# Patient Record
Sex: Female | Born: 1990 | Race: White | Hispanic: No | Marital: Married | Smoking: Never smoker
Health system: Southern US, Community
[De-identification: ages and names within clinical notes are randomized; demographics above are authoritative.]

## PROBLEM LIST (undated history)

## (undated) DIAGNOSIS — N809 Endometriosis, unspecified: Secondary | ICD-10-CM

## (undated) DIAGNOSIS — D649 Anemia, unspecified: Secondary | ICD-10-CM

## (undated) DIAGNOSIS — N83209 Unspecified ovarian cyst, unspecified side: Secondary | ICD-10-CM

## (undated) HISTORY — DX: Endometriosis, unspecified: N80.9

## (undated) HISTORY — DX: Anemia, unspecified: D64.9

## (undated) HISTORY — DX: Unspecified ovarian cyst, unspecified side: N83.209

---

## 2012-12-29 ENCOUNTER — Ambulatory Visit: Payer: Self-pay | Admitting: Family Medicine

## 2014-09-24 IMAGING — US US PELV - US TRANSVAGINAL
2 series · 14 of 25 positions shown · non-contrast
Comparison: None

CLINICAL DATA: Nausea, vomiting, pelvic pain.

EXAM:
TRANSABDOMINAL AND TRANSVAGINAL ULTRASOUND OF PELVIS
TECHNIQUE: Both transabdominal and transvaginal ultrasound examinations of the
pelvis were performed. Transabdominal technique was performed for
global imaging of the pelvis including uterus, ovaries, adnexal
regions, and pelvic cul-de-sac. It was necessary to proceed with
endovaginal exam following the transabdominal exam to visualize the
uterus, endometrium, ovaries and adnexa .

[Series 1: us pelv - us transvaginal · 0.28mm/px · 1 of 1 slices shown (1 of 2)]
[im 1/1]
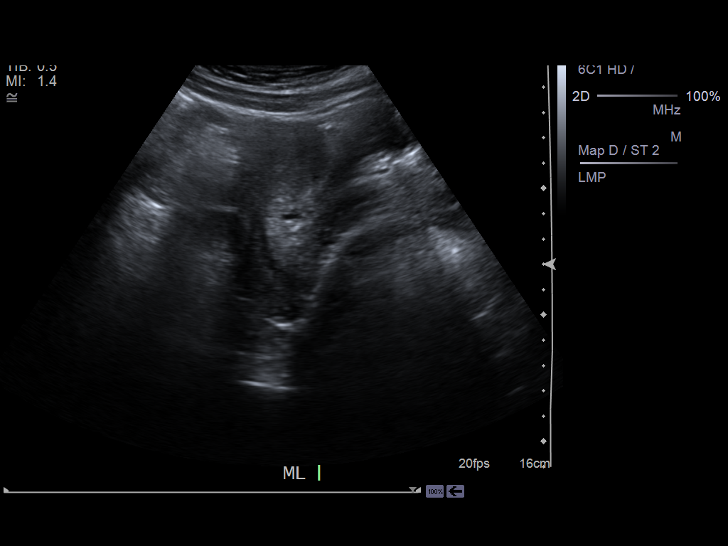

[Series 2: us pelv - us transvaginal · 0.18mm/px · 13 of 60 slices shown (2 of 2)]
[im 3/60]
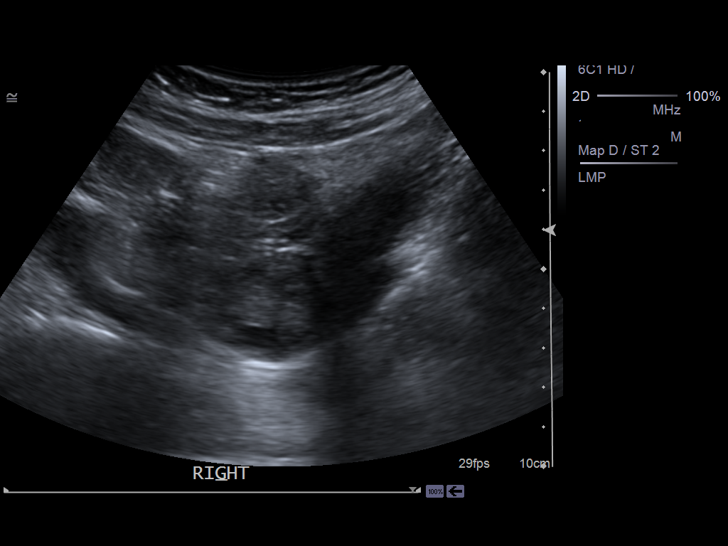
[im 8/60]
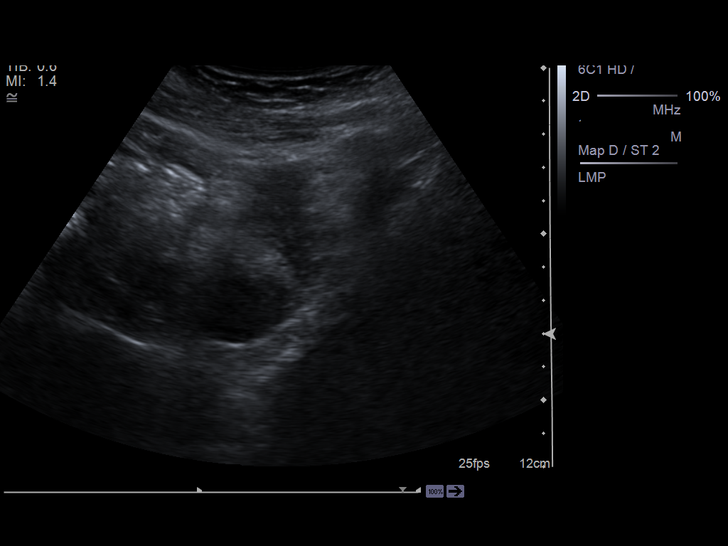
[im 13/60]
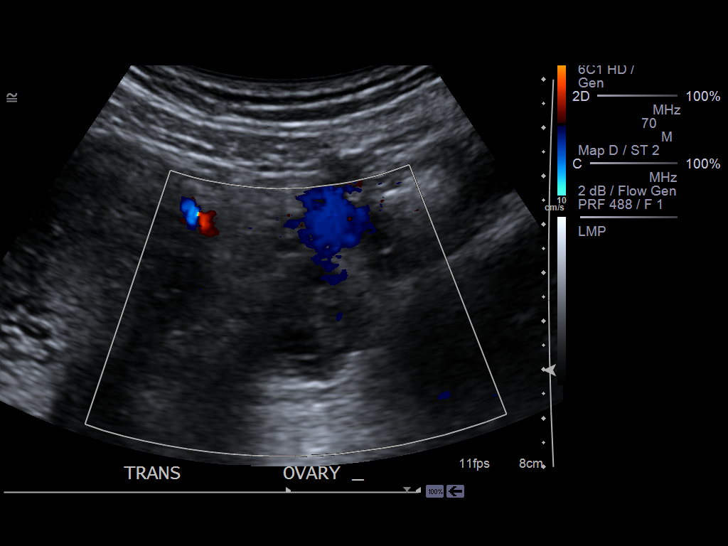
[im 18/60]
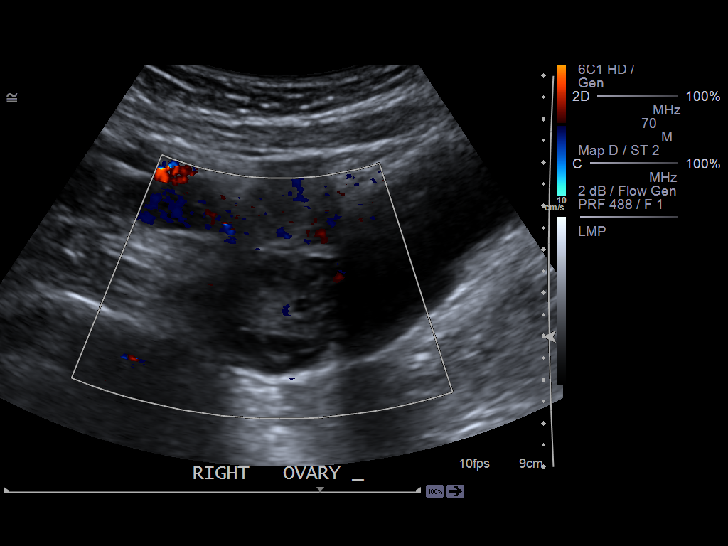
[im 21/60]
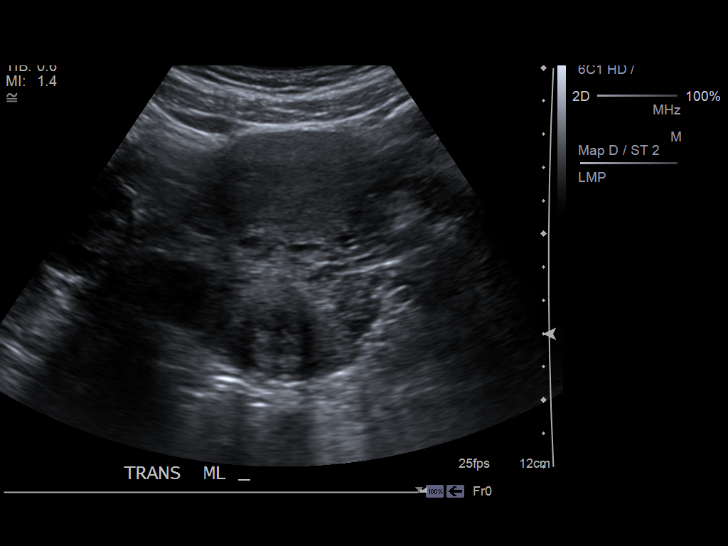
[im 26/60]
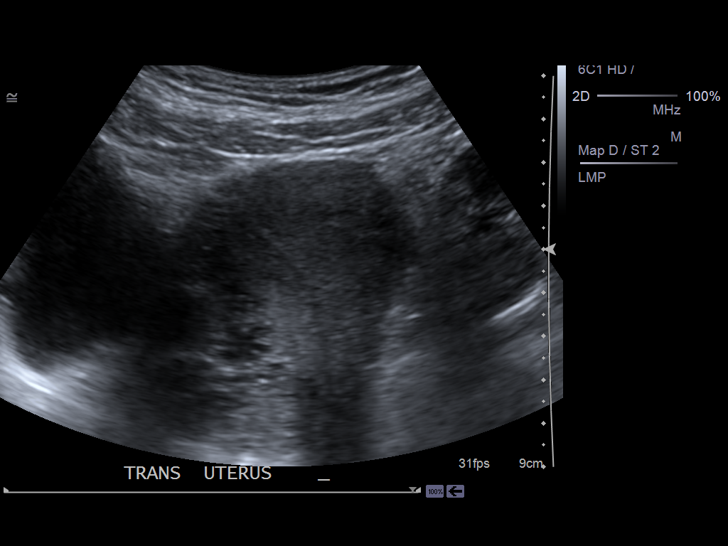
[im 31/60]
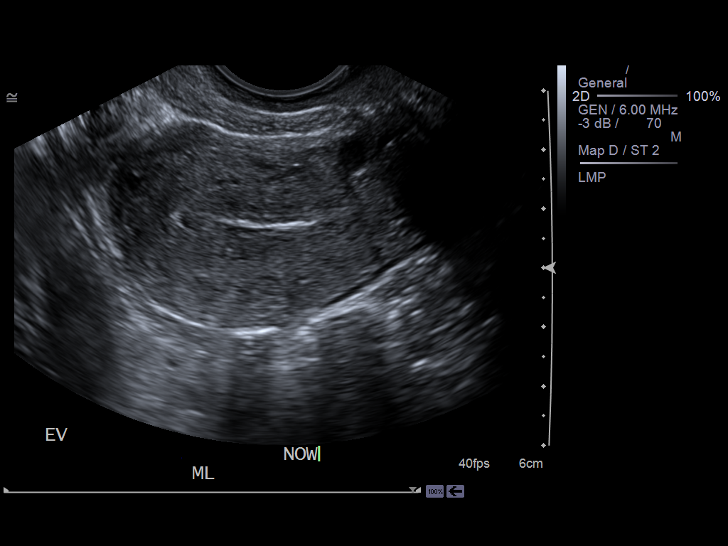
[im 36/60]
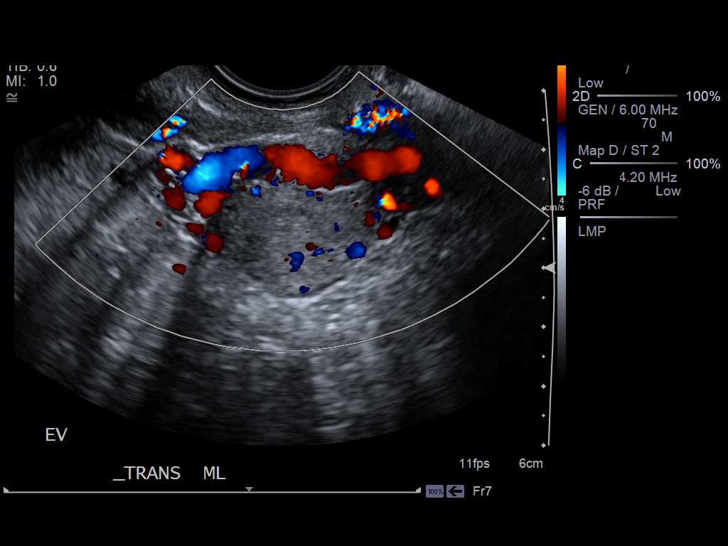
[im 39/60]
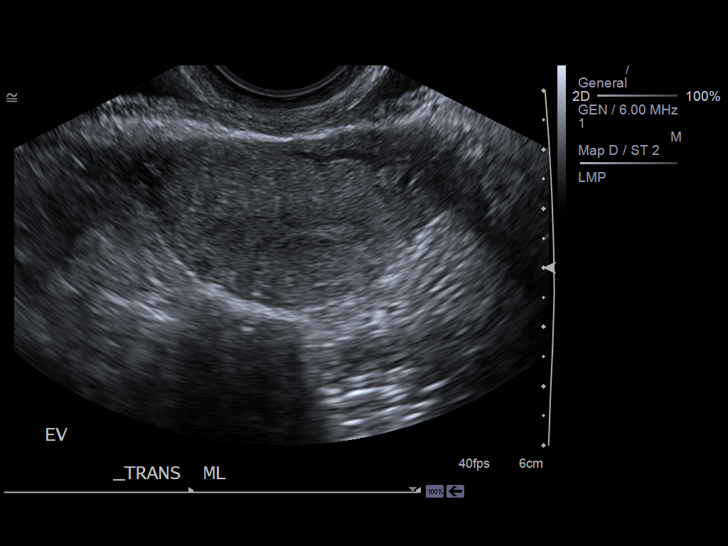
[im 44/60]
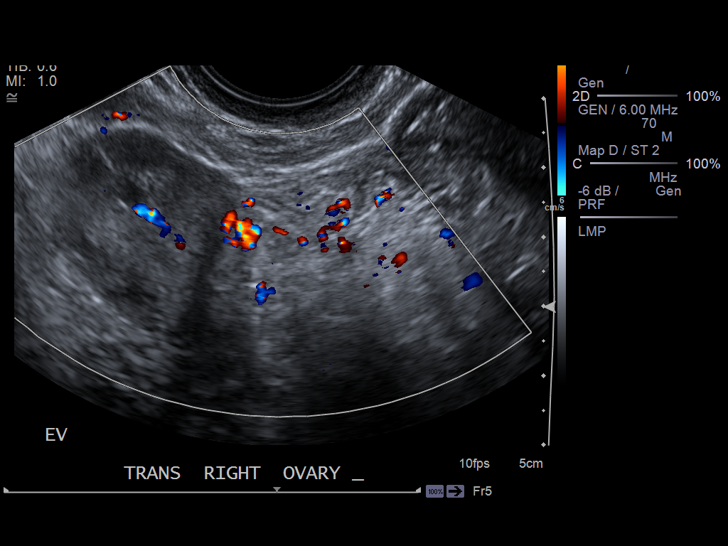
[im 49/60]
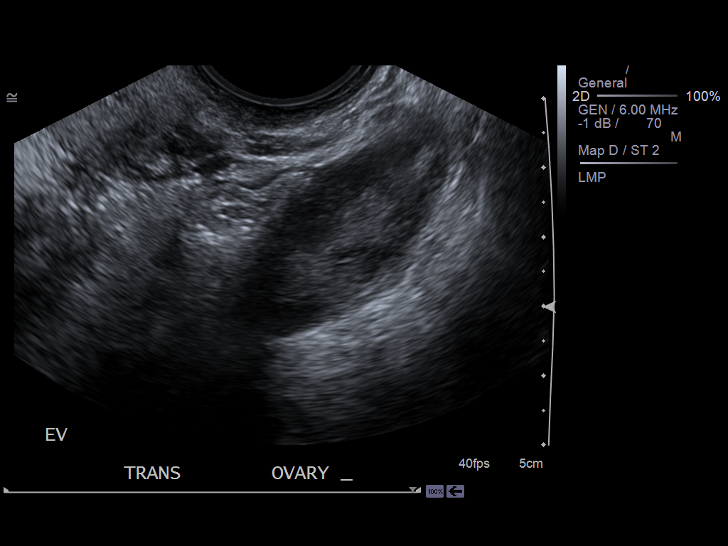
[im 54/60]
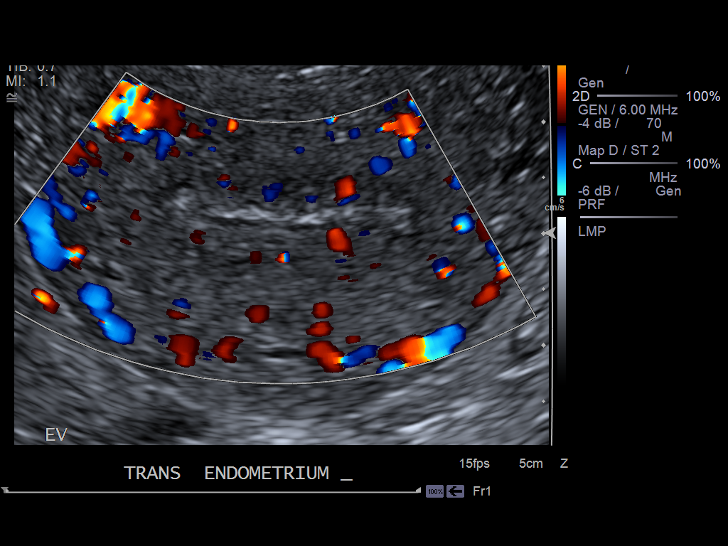
[im 60/60]
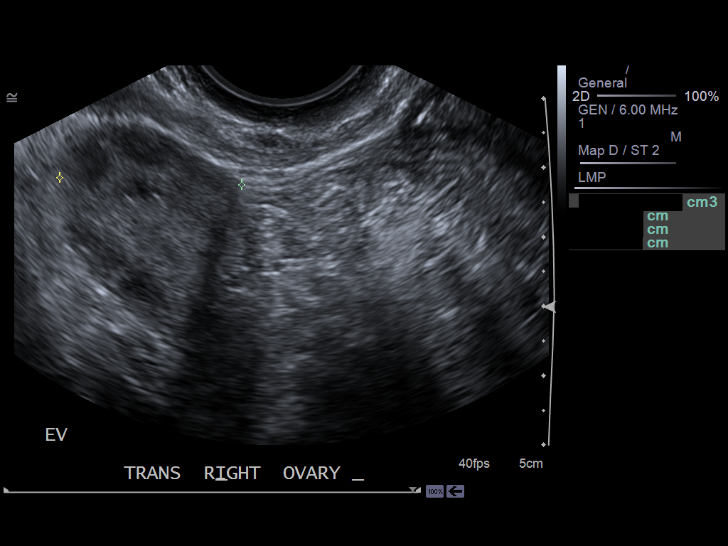

[14 of 25 positions shown; findings below may reference images not displayed]

FINDINGS: Uterus

Measurements: 7.9 x 4.0 x 4.6 cm. No fibroids or other mass
visualized.

Endometrium

Thickness: Normal thickness, 5 mm.. No focal abnormality visualized.

Right ovary

Measurements: 3.5 x 2.1 x 2.6 cm. Normal appearance/no adnexal mass.

Left ovary

Measurements: 3.2 x 2.1 x 3.9 cm. Normal appearance/no adnexal mass.

Other findings

No free fluid.
IMPRESSION: Unremarkable study.

## 2017-07-28 ENCOUNTER — Ambulatory Visit (INDEPENDENT_AMBULATORY_CARE_PROVIDER_SITE_OTHER): Payer: 59 | Admitting: Obstetrics & Gynecology

## 2017-07-28 ENCOUNTER — Other Ambulatory Visit (HOSPITAL_COMMUNITY)
Admission: RE | Admit: 2017-07-28 | Discharge: 2017-07-28 | Disposition: A | Discharge status: Home / Self Care | Payer: 59 | Source: Ambulatory Visit | Attending: Obstetrics & Gynecology | Admitting: Obstetrics & Gynecology

## 2017-07-28 ENCOUNTER — Encounter: Payer: Self-pay | Admitting: Obstetrics & Gynecology

## 2017-07-28 VITALS — BP 120/80 | Ht 64.0 in | Wt 184.0 lb

## 2017-07-28 DIAGNOSIS — Z124 Encounter for screening for malignant neoplasm of cervix: Secondary | ICD-10-CM | POA: Diagnosis not present

## 2017-07-28 DIAGNOSIS — N921 Excessive and frequent menstruation with irregular cycle: Secondary | ICD-10-CM | POA: Diagnosis not present

## 2017-07-28 DIAGNOSIS — D500 Iron deficiency anemia secondary to blood loss (chronic): Secondary | ICD-10-CM

## 2017-07-28 MED ORDER — MEDROXYPROGESTERONE ACETATE 10 MG PO TABS: ORAL_TABLET | ORAL | 0 refills | Status: DC

## 2017-07-28 NOTE — Patient Instructions (Signed)
Medroxyprogesterone tablets What is this medicine? MEDROXYPROGESTERONE (me DROX ee proe JES te rone) is a hormone in a class called progestins. It is commonly used to prevent the uterine lining from overgrowth in women taking an estrogen after menopause. It is also used to treat irregular menstrual bleeding or a lack of menstrual bleeding in women. This medicine may be used for other purposes; ask your health care provider or pharmacist if you have questions. COMMON BRAND NAME(S): Amen, Provera What should I tell my health care provider before I take this medicine? They need to know if you have any of these conditions: -blood vessel disease or a history of a blood clot in the lungs or legs -breast, cervical or vaginal cancer -heart disease -kidney disease -liver disease -migraine -recent miscarriage or abortion -mental depression -migraine -seizures (convulsions) -stroke -vaginal bleeding that has not been evaluated -an unusual or allergic reaction to medroxyprogesterone, other medicines, foods, dyes, or preservatives -pregnant or trying to get pregnant -breast-feeding How should I use this medicine? Take this medicine by mouth with a glass of water. Follow the directions on the prescription label. Take your doses at regular intervals. Do not take your medicine more often than directed. Talk to your pediatrician regarding the use of this medicine in children. Special care may be needed. While this drug may be prescribed for children as young as 13 years for selected conditions, precautions do apply. Overdosage: If you think you have taken too much of this medicine contact a poison control center or emergency room at once. NOTE: This medicine is only for you. Do not share this medicine with others. What if I miss a dose? If you miss a dose, take it as soon as you can. If it is almost time for your next dose, take only that dose. Do not take double or extra doses. What may interact with  this medicine? -barbiturate medicines for inducing sleep or treating seizures (convulsions) -bosentan -carbamazepine -phenytoin -rifampin -St. John's Wort This list may not describe all possible interactions. Give your health care provider a list of all the medicines, herbs, non-prescription drugs, or dietary supplements you use. Also tell them if you smoke, drink alcohol, or use illegal drugs. Some items may interact with your medicine. What should I watch for while using this medicine? Visit your health care professional for regular checks on your progress. You will need a regular breast and pelvic exam. If you have any reason to think you are pregnant, stop taking this medicine at once and contact your doctor or health care professional. What side effects may I notice from receiving this medicine? Side effects that you should report to your doctor or health care professional as soon as possible: -breast tenderness or discharge -changes in mood or emotions, such as depression -changes in vision or speech -pain in the abdomen, chest, groin, or leg -severe headache -skin rash, itching, or hives -sudden shortness of breath -unusually weak or tired -yellowing of skin or eyes Side effects that usually do not require medical attention (report to your doctor or health care professional if they continue or are bothersome): -acne -change in menstrual bleeding pattern or flow -changes in sexual desire -facial hair growth -fluid retention and swelling -headache -upset stomach -weight gain or loss This list may not describe all possible side effects. Call your doctor for medical advice about side effects. You may report side effects to FDA at 1-800-FDA-1088. Where should I keep my medicine? Keep out of the reach of children.   Store at room temperature between 20 and 25 degrees C (68 and 77 degrees F). Throw away any unused medicine after the expiration date. NOTE: This sheet is a summary. It  may not cover all possible information. If you have questions about this medicine, talk to your doctor, pharmacist, or health care provider.  2018 Elsevier/Gold Standard (2007-12-21 11:26:12)  

## 2017-07-28 NOTE — Progress Notes (Signed)
HPI:      [name] is a 27 y.o. G1P0010 who LMP was Patient's last menstrual period was 07/04/2017., presents today for a problem visit.  She complains of metrorrhagia (no pattern) that  began several weeks ago and its severity is described as moderate. She had premenstrual spotting for one week then period for 8 days (usu 5) then postmenstrual spotting and at times heavier bleeding for the last 3 weeks. She usually has regular periods every 28 days and they are associated with mild menstrual cramping.  Assoc w dyspareunia.  She has used the following for attempts at control: maxi pad.  Previous evaluation: none. Prior Diagnosis: anemia due to periods, no other dx. Previous Treatment: tried OCP as 27 yo and did not tolerate then.  She is single partner, contraception - none.  Contraception: none. Hx of STDs: none. She is premenopausal.  PMHx: She  has a past medical history of Anemia. Also,  has no past surgical history on file., family history is not on file. She was adopted.,  reports that she has never smoked. She has never used smokeless tobacco. She reports that she drank alcohol. She reports that she does not use drugs.  She takes no meds now. Also, has No Known Allergies.  Review of Systems  Constitutional: Negative for chills, fever and malaise/fatigue.  HENT: Negative for congestion, sinus pain and sore throat.   Eyes: Negative for blurred vision and pain.  Respiratory: Negative for cough and wheezing.   Cardiovascular: Negative for chest pain and leg swelling.  Gastrointestinal: Negative for abdominal pain, constipation, diarrhea, heartburn, nausea and vomiting.  Genitourinary: Negative for dysuria, frequency, hematuria and urgency.  Musculoskeletal: Negative for back pain, joint pain, myalgias and neck pain.  Skin: Negative for itching and rash.  Neurological: Negative for dizziness, tremors and weakness.  Endo/Heme/Allergies: Does not bruise/bleed easily.    Psychiatric/Behavioral: Negative for depression. The patient is not nervous/anxious and does not have insomnia.     Objective: BP 120/80   Ht 5\' 4"  (1.626 m)   Wt 184 lb (83.5 kg)   LMP 07/04/2017   BMI 31.58 kg/m  Physical Exam  Constitutional: She is oriented to person, place, and time. She appears well-developed and well-nourished. No distress.  Genitourinary: Rectum normal, vagina normal and uterus normal. Pelvic exam was performed with patient supine. There is no rash or lesion on the right labia. There is no rash or lesion on the left labia. Vagina exhibits no lesion. No bleeding in the vagina. Right adnexum does not display mass and does not display tenderness. Left adnexum does not display mass and does not display tenderness. Cervix does not exhibit motion tenderness, lesion, friability or polyp.   Uterus is mobile and midaxial. Uterus is not enlarged or exhibiting a mass.  HENT:  Head: Normocephalic and atraumatic. Head is without laceration.  Right Ear: Hearing normal.  Left Ear: Hearing normal.  Nose: No epistaxis.  No foreign bodies.  Mouth/Throat: Uvula is midline, oropharynx is clear and moist and mucous membranes are normal.  Eyes: Pupils are equal, round, and reactive to light.  Neck: Normal range of motion. Neck supple. No thyromegaly present.  Cardiovascular: Normal rate and regular rhythm. Exam reveals no gallop and no friction rub.  No murmur heard. Pulmonary/Chest: Effort normal and breath sounds normal. No respiratory distress. She has no wheezes. Right breast exhibits no mass, no skin change and no tenderness. Left breast exhibits no mass, no skin change and no tenderness.  Abdominal: Soft. Bowel sounds are normal. She exhibits no distension. There is no tenderness. There is no rebound.  Musculoskeletal: Normal range of motion.  Neurological: She is alert and oriented to person, place, and time. No cranial nerve deficit.  Skin: Skin is warm and dry.   Psychiatric: She has a normal mood and affect. Judgment normal.  Vitals reviewed.   ASSESSMENT/PLAN:  menometrorrhagia this cycle only.  Problem List Items Addressed This Visit      Other   Menorrhagia with irregular cycle - Primary   Relevant Orders   US PELVIS TRANSVANGINAL NON-OB (TV ONLY)   CBC   Iron deficiency anemia due to chronic blood loss   Relevant Orders   US PELVIS TRANSVANGINAL NON-OB (TV ONLY)   CBC    Other Visit Diagnoses    Screening for cervical cancer       Relevant Orders   Cytology - PAP    Provera for current bleeding. [place] and CBC Patient has abnormal uterine bleeding . She has a normal exam today, with no evidence of lesions.  Evaluation includes the following: exam, labs such as hormonal testing, and pelvic ultrasound to evaluate for any structural gynecologic abnormalities.  Patient to follow up after testing.  Treatment option for menorrhagia or menometrorrhagia discussed in great detail with the patient.  Options include hormonal therapy, IUD therapy such as Mirena, D&C.  The pros and cons of each option discussed with patient.  Annamarie Major, MD, [facility], [facility] 07/28/2017  3:52 PM

## 2017-07-29 LAB — CBC
Hematocrit: 41.4 % (ref 34.0–46.6)
Hemoglobin: 12.8 g/dL (ref 11.1–15.9)
MCH: 27.8 pg (ref 26.6–33.0)
MCHC: 30.9 g/dL — ABNORMAL LOW (ref 31.5–35.7)
MCV: 90 fL (ref 79–97)
Platelets: 309 10*3/uL (ref 150–450)
RBC: 4.61 x10E6/uL (ref 3.77–5.28)
RDW: 12.8 % (ref 12.3–15.4)
WBC: 7.7 10*3/uL (ref 3.4–10.8)

## 2017-08-01 LAB — CYTOLOGY - PAP
Chlamydia: NEGATIVE
Diagnosis: NEGATIVE
Neisseria Gonorrhea: NEGATIVE

## 2017-08-02 ENCOUNTER — Ambulatory Visit: Payer: 59 | Admitting: Obstetrics & Gynecology

## 2017-08-02 ENCOUNTER — Other Ambulatory Visit: Payer: 59

## 2017-08-19 ENCOUNTER — Encounter: Payer: Self-pay | Admitting: Obstetrics & Gynecology

## 2017-08-19 ENCOUNTER — Ambulatory Visit (INDEPENDENT_AMBULATORY_CARE_PROVIDER_SITE_OTHER): Payer: 59

## 2017-08-19 ENCOUNTER — Ambulatory Visit (INDEPENDENT_AMBULATORY_CARE_PROVIDER_SITE_OTHER): Payer: 59 | Admitting: Obstetrics & Gynecology

## 2017-08-19 VITALS — BP 112/80 | Ht 64.0 in | Wt 186.0 lb

## 2017-08-19 DIAGNOSIS — N921 Excessive and frequent menstruation with irregular cycle: Secondary | ICD-10-CM | POA: Diagnosis not present

## 2017-08-19 DIAGNOSIS — N8301 Follicular cyst of right ovary: Secondary | ICD-10-CM | POA: Diagnosis not present

## 2017-08-19 DIAGNOSIS — D500 Iron deficiency anemia secondary to blood loss (chronic): Secondary | ICD-10-CM

## 2017-08-19 MED ORDER — IBUPROFEN 800 MG PO TABS: ORAL_TABLET | ORAL | 6 refills | Status: DC

## 2017-08-19 MED ORDER — MISOPROSTOL 200 MCG PO TABS: 200.0000 ug | ORAL_TABLET | Freq: Once | ORAL | 0 refills | Status: DC

## 2017-08-19 NOTE — Progress Notes (Signed)
  HPI: Dysfunctional Uterine Bleeding Patient complains of irregular menses. She had been bleeding irregularly. She is now not bleeding after Provera given, followed by 8 day period, then stopped now. Dysmenorrhea:mild, occurring throughout menses. Cyclic symptoms include: none. Current contraception: none. History of infertility: no. History of abnormal Pap smear: no. Ultrasound demonstrates no masses seen, possible ovarian cyst, see below  PMHx: She  has a past medical history of Anemia. Also,  has no past surgical history on file., family history is not on file. She was adopted.,  reports that she has never smoked. She has never used smokeless tobacco. She reports that she drank alcohol. She reports that she does not use drugs.  She has a current medication list which includes the following prescription(s): medroxyprogesterone. Also, has No Known Allergies.  Review of Systems  All other systems reviewed and are negative.  Objective: BP 112/80   Ht 5\' 4"  (1.626 m)   Wt 186 lb (84.4 kg)   LMP 08/09/2017   BMI 31.93 kg/m   Physical examination Constitutional NAD, Conversant  Skin No rashes, lesions or ulceration.   Extremities: Moves all appropriately.  Normal ROM for age. No lymphadenopathy.  Neuro: Grossly intact  Psych: Oriented to PPT.  Normal mood. Normal affect.   [place] Pelvis Transvanginal Non-ob (tv Only)  Result Date: 08/19/2017 ULTRASOUND REPORT Location: [facility] Date of Service: 08/19/2017 Patient Name: Mary Diaz DOB: 03/08/1990 MRN: [id number] Indications:AUB Findings: The uterus is anteverted and measures 8.89 x 5.36 x 3.94 cm. Echo texture is homogenous without evidence of focal masses. The Endometrium measures 15.02 mm. - Complex, hypoechoic endometrium possible blood clot is present. Right Ovary measures 4.04 x 3.23 x 3.11 cm. It contain follicle and isoechoic area measure 2.16 x 2.10 x 1.94 cm ,possible endometrioma vs other etiology. Left Ovary measures  3.96 x 2.72 x 155 cm. It is normal in appearance. Survey of the adnexa demonstrates no adnexal masses. There is no free fluid in the cul de sac. Impression: 1. Complex, hypoechoic endometrium possible blood clot is present. 2.Right ovary isoechoic area measure 2.16 x 2.10 x 1.94 cm ,possible endometrioma vs other etiology. Recommendations: 1.Clinical correlation with the patient's History and Physical Exam. Mital bahen P Patel Review of ULTRASOUND.    I have personally reviewed images and report [facility]    Plan of management to be discussed with patient. Annamarie MajorPaul Kellye Mizner, MD, FACOG [facility], [facility] 08/19/2017  10:04 AM   Assessment:  Menorrhagia with irregular cycle Ovarian cyst  Options for management discussed.  [facility]    Pretreat w Cytotec and IBF    Pt to call and schedule Treatment option for menorrhagia or menometrorrhagia discussed in great detail with the patient.  Options include hormonal therapy, IUD therapy such as Kyleena, D&C.  The pros and cons of each option discussed with patient.  A total of 15 minutes were spent face-to-face with the patient during this encounter and over half of that time dealt with counseling and coordination of care.  Annamarie MajorPaul Misha Antonini, MD, [facility] Frederick[facility]AC[facility], [facility] 08/19/2017  10:04 AM

## 2017-08-22 ENCOUNTER — Telehealth: Payer: Self-pay | Admitting: Obstetrics & Gynecology

## 2017-08-22 NOTE — Telephone Encounter (Signed)
Patient scheduled 9/4 for IUD insert, unsure IUD, [facility]

## 2017-08-22 NOTE — Telephone Encounter (Signed)
[facility] visit note reserved for this patient.

## 2017-09-06 NOTE — Telephone Encounter (Signed)
Kyleena reserved for this patient. 

## 2017-09-07 ENCOUNTER — Ambulatory Visit (INDEPENDENT_AMBULATORY_CARE_PROVIDER_SITE_OTHER): Payer: 59 | Admitting: Obstetrics & Gynecology

## 2017-09-07 ENCOUNTER — Encounter: Payer: Self-pay | Admitting: Obstetrics & Gynecology

## 2017-09-07 VITALS — BP 120/80 | Ht 64.0 in | Wt 183.0 lb

## 2017-09-07 DIAGNOSIS — N921 Excessive and frequent menstruation with irregular cycle: Secondary | ICD-10-CM

## 2017-09-07 DIAGNOSIS — Z30430 Encounter for insertion of intrauterine contraceptive device: Secondary | ICD-10-CM

## 2017-09-07 NOTE — Patient Instructions (Signed)

## 2017-09-07 NOTE — Progress Notes (Signed)
  IUD PROCEDURE NOTE:  Mary Diaz is a 27 y.o. G1P0010 here for Pal[place] IUD insertion. No GYN concerns.  Last pap smear was normal.  IUD Insertion Procedure Note Patient identified, informed consent performed, consent signed.   Discussed risks of irregular bleeding, cramping, infection, malpositioning or misplacement of the IUD outside the uterus which may require further procedure such as laparoscopy, risk of failure <1%. Time out was performed.  Urine pregnancy test negative.  A bimanual exam showed the uterus to be midposition.  Speculum placed in the vagina.  Cervix visualized.  Cleaned with Betadine x 2.  Grasped anteriorly with a single tooth tenaculum.  Uterus sounded to 7 cm.   IUD placed per manufacturer's recommendations.  Strings trimmed to 3 cm. Tenaculum was removed, good hemostasis noted.  Patient tolerated procedure well.   Patient was given post-procedure instructions.  She was advised to have backup contraception for one week.  Patient was also asked to check IUD strings periodically and follow up in 4 weeks for IUD check.  Annamarie Major, MD, [facility], [facility] 09/07/2017  10:23 AM

## 2017-09-15 ENCOUNTER — Telehealth: Payer: Self-pay

## 2017-09-15 NOTE — Telephone Encounter (Signed)
Adv pt irreg bleeding is not uncommon after getting IUD.  It takes the body a good 3067m to adjust to bc.  Is okay as long as not saturating a pad every 2830min-1hr.

## 2017-09-15 NOTE — Telephone Encounter (Signed)
Pt had IUD inserted 9/4 and bleeding hasn't stopped.  [phone]  Mailbox full.

## 2017-10-06 ENCOUNTER — Ambulatory Visit (INDEPENDENT_AMBULATORY_CARE_PROVIDER_SITE_OTHER): Payer: 59 | Admitting: Obstetrics & Gynecology

## 2017-10-06 ENCOUNTER — Encounter: Payer: Self-pay | Admitting: Obstetrics & Gynecology

## 2017-10-06 VITALS — BP 120/80 | Ht 64.0 in | Wt 175.0 lb

## 2017-10-06 DIAGNOSIS — Z30431 Encounter for routine checking of intrauterine contraceptive device: Secondary | ICD-10-CM

## 2017-10-06 NOTE — Progress Notes (Signed)
  History of Present Illness:  Mary Diaz is a 27 y.o. that had a [place] IUD placed approximately 4 weeks ago. Since that time, she states that she has had no pain and some bleeding almost daily although flow is somewhat decreasing over time  PMHx: She  has a past medical history of Anemia. Also,  has no past surgical history on file., family history is not on file. She was adopted.,  reports that she has never smoked. She has never used smokeless tobacco. She reports that she drank alcohol. She reports that she does not use drugs. Current Meds  Medication Sig  . ibuprofen (ADVIL,MOTRIN) 800 MG tablet Take the night before the procedure and one hour prior to procedure. Then TID PRN.  Marland Kitchen Levonorgestrel (KYLEENA) 19.5 MG IUD by Intrauterine route.  .  Also, has No Known Allergies..  Review of Systems  All other systems reviewed and are negative.  Physical Exam:  BP 120/80   Ht 5\' 4"  (1.626 m)   Wt 175 lb (79.4 kg)   BMI 30.04 kg/m  Body mass index is 30.04 kg/m. Constitutional: Well nourished, well developed female in no acute distress.  Abdomen: diffusely non tender to palpation, non distended, and no masses, hernias Neuro: Grossly intact Psych:  Normal mood and affect.    Pelvic exam:  Two IUD strings present seen coming from the cervical os. EGBUS, vaginal vault and cervix: within normal limits  Assessment: IUD strings present in proper location; pt doing well  Plan: She was told to continue to use barrier contraception, in order to prevent any STIs, and to take a home pregnancy test or call us if she ever thinks she may be pregnant, and that her IUD expires in 5 years.  She was amenable to this plan and we will see her back in 1 year/PRN.  A total of 15 minutes were spent face-to-face with the patient during this encounter and over half of that time dealt with counseling and coordination of care.  Annamarie Major, MD, [facility], [facility] 10/06/2017  10:06 AM

## 2018-02-21 ENCOUNTER — Ambulatory Visit (INDEPENDENT_AMBULATORY_CARE_PROVIDER_SITE_OTHER): Payer: 59 | Admitting: Obstetrics & Gynecology

## 2018-02-21 ENCOUNTER — Other Ambulatory Visit: Payer: Self-pay | Admitting: Obstetrics & Gynecology

## 2018-02-21 ENCOUNTER — Encounter: Payer: Self-pay | Admitting: Obstetrics & Gynecology

## 2018-02-21 VITALS — BP 120/80 | Ht 64.0 in | Wt 188.0 lb

## 2018-02-21 DIAGNOSIS — R1031 Right lower quadrant pain: Secondary | ICD-10-CM

## 2018-02-21 DIAGNOSIS — R102 Pelvic and perineal pain: Secondary | ICD-10-CM | POA: Diagnosis not present

## 2018-02-21 NOTE — Progress Notes (Signed)
Gynecology Pelvic Pain Evaluation   Chief Complaint  Patient presents with  . Pelvic pain  . Dysmenorrhea   History of Present Illness:   Patient is a 28 y.o. G1P0010 who LMP was Patient's last menstrual period was 02/14/2018., presents today for a problem visit.  She complains of pain.   Her pain is localized to the lower pelvis throughout from Sat to today and more so just RLQ today, described as constant, sharp and stabbing, began several days ago and its severity is described as severe. The pain radiates to the  right lower quadrant. She has these associated symptoms which include none and denies fever, nausea, vomiting, diarrhea, constipation, vaginal discharge.  She is having her spotting type period now, which she has had monthly without pain since IUD placed Sept 2019. Patient has these modifiers which include relaxation and pain medication that make it better and unable to associate with any factor that make it worse.  Context includes: concern for relationship to [facility] IUD placed last year.  She also has been more active w lifting lately.  Previous evaluation: none. Prior Diagnosis: none. Previous Treatment: none.  PMHx: She  has a past medical history of Anemia. Also,  has no past surgical history on file., family history is not on file. She was adopted.,  reports that she has never smoked. She has never used smokeless tobacco. She reports previous alcohol use. She reports that she does not use drugs.  She has a current medication list which includes the following prescription(s): ibuprofen, levonorgestrel, medroxyprogesterone, and misoprostol. Also, has No Known Allergies.  Review of Systems  Constitutional: Negative for chills, fever and malaise/fatigue.  HENT: Negative for congestion, sinus pain and sore throat.   Eyes: Negative for blurred vision and pain.  Respiratory: Negative for cough and wheezing.   Cardiovascular: Negative for chest pain and leg swelling.    Gastrointestinal: Negative for abdominal pain, constipation, diarrhea, heartburn, nausea and vomiting.  Genitourinary: Negative for dysuria, frequency, hematuria and urgency.  Musculoskeletal: Negative for back pain, joint pain, myalgias and neck pain.  Skin: Negative for itching and rash.  Neurological: Negative for dizziness, tremors and weakness.  Endo/Heme/Allergies: Does not bruise/bleed easily.  Psychiatric/Behavioral: Negative for depression. The patient is not nervous/anxious and does not have insomnia.    Objective: BP 120/80   Ht 5\' 4"  (1.626 m)   Wt 188 lb (85.3 kg)   LMP 02/14/2018   BMI 32.27 kg/m  Physical Exam Constitutional:      General: She is not in acute distress.    Appearance: She is well-developed.  Genitourinary:     Pelvic exam was performed with patient supine.     Vagina and uterus normal.     No vaginal erythema or bleeding.     No cervical motion tenderness, discharge, polyp or nabothian cyst.     Uterus is mobile.     Uterus is not enlarged.     No uterine mass detected.    Uterus is midaxial.     No right or left adnexal mass present.     Right adnexa not tender.     Left adnexa not tender.  HENT:     Head: Normocephalic and atraumatic.     Nose: Nose normal.  Neck:     Musculoskeletal: Full passive range of motion without pain.     Thyroid: No thyroid mass, thyromegaly or thyroid tenderness.  Cardiovascular:     Rate and Rhythm: Normal rate.  Heart sounds: Normal heart sounds. No murmur. No friction rub. No gallop.   Pulmonary:     Effort: Pulmonary effort is normal.     Breath sounds: Normal breath sounds.  Abdominal:     General: There is no distension.     Palpations: Abdomen is soft.     Tenderness: There is no abdominal tenderness.  Musculoskeletal: Normal range of motion.  Neurological:     Mental Status: She is alert and oriented to person, place, and time.     Cranial Nerves: No cranial nerve deficit.  Skin:    General:  Skin is warm and dry.   Female chaperone present for pelvic portion of the physical exam  Assessment: 28 y.o. G1P0010 with new onset of pain- etiology unclear.   Pelvic pain    RLQ abdominal pain        GYN etiology includes IUD malrotation, ovarian cyst, dysmenorrhea, other. Other etiology includes hernia, other.  Plan Ultrasound (tomorrow) to assess GYN.   IBF for pain.  Annamarie Major, MD, [facility], [facility] 02/21/2018  2:14 PM

## 2018-02-22 ENCOUNTER — Ambulatory Visit (INDEPENDENT_AMBULATORY_CARE_PROVIDER_SITE_OTHER): Payer: 59 | Admitting: Obstetrics & Gynecology

## 2018-02-22 ENCOUNTER — Ambulatory Visit (INDEPENDENT_AMBULATORY_CARE_PROVIDER_SITE_OTHER): Payer: 59

## 2018-02-22 ENCOUNTER — Encounter: Payer: Self-pay | Admitting: Obstetrics & Gynecology

## 2018-02-22 VITALS — BP 120/80 | Ht 64.0 in | Wt 187.0 lb

## 2018-02-22 DIAGNOSIS — R1031 Right lower quadrant pain: Secondary | ICD-10-CM | POA: Diagnosis not present

## 2018-02-22 DIAGNOSIS — N83291 Other ovarian cyst, right side: Secondary | ICD-10-CM

## 2018-02-22 DIAGNOSIS — N83201 Unspecified ovarian cyst, right side: Secondary | ICD-10-CM

## 2018-02-22 NOTE — Progress Notes (Signed)
HPI: Abdominal Pain Patient presents for evaluation of abdominal pain. Her pain is localized to the lower pelvis throughout from Sat to today and more so just RLQ today, described as constant, sharp and stabbing, began several days ago and its severity is described as severe although last 2 days it is more an annoyance than severely painful. The pain radiates to the  right lower quadrant. She has these associated symptoms which include none and denies fever, nausea, vomiting, diarrhea, constipation, vaginal discharge.  She is having her spotting type period now, which she has had monthly without pain since IUD placed Sept 2019. Patient has these modifiers which include relaxation and pain medication that make it better and unable to associate with any factor that make it worse.  Context includes: concern for relationship to [facility] IUD placed last year.  She also has been more active w lifting lately.  Ultrasound demonstrates cyst seen right adnexa, see below.  PMHx: She  has a past medical history of Anemia. Also,  has no past surgical history on file., family history is not on file. She was adopted.,  reports that she has never smoked. She has never used smokeless tobacco. She reports previous alcohol use. She reports that she does not use drugs.  She has a current medication list which includes the following prescription(s): ibuprofen, levonorgestrel, medroxyprogesterone, and misoprostol. Also, has No Known Allergies.  Review of Systems  All other systems reviewed and are negative.  Objective: BP 120/80   Ht 5\' 4"  (1.626 m)   Wt 187 lb (84.8 kg)   LMP 02/14/2018   BMI 32.10 kg/m   Physical examination Constitutional NAD, Conversant  Skin No rashes, lesions or ulceration.   Extremities: Moves all appropriately.  Normal ROM for age. No lymphadenopathy.  Neuro: Grossly intact  Psych: Oriented to PPT.  Normal mood. Normal affect.   US Pelvis Transvanginal Non-ob (tv Only)  Result  Date: 02/22/2018 Patient Name: Mary Diaz DOB: 1990/06/20 MRN: [id number] ULTRASOUND REPORT Location: [facility] Date of Service: 02/22/2018 Indications:Pelvic Pain Findings: The uterus is anteverted and measures 7.3 x 4.5 x 3.7cm. Echo texture is homogenous without evidence of focal masses. The Endometrium measures 6.47mm. IUD in place. Right Ovary measures 6.3 x 5.6 x 4.9cm with two complex cysts. Ultrasound appearance of endometriomas.    1.  4.2 x 4.1 x 3.9cm    2.  1.6 x 2.3 x 2.0cm Left Ovary measures 3.2 x 1.6 x 2.0cm. It is normal in appearance. Survey of the adnexa demonstrates no adnexal masses. There is no free fluid in the cul de sac. Impression: 1. Two complex lesions seen in right ovary. Ultrasound appearance of endometriomas, otherwise normal gyn ultrasound. Recommendations: 1.Clinical correlation with the patient's History and Physical Exam. Darlina Guys, RDMS RVT Review of ULTRASOUND.    I have personally reviewed images and report of recent ultrasound done at [facility].    Plan of management to be discussed with patient. Annamarie Major, MD, FACOG [facility], [facility] 02/22/2018  10:18 AM   Assessment:  RLQ abdominal pain Right ovarian cyst   - Plan: US PELVIS TRANSVANGINAL NON-OB (TV ONLY) for follow up on cyst regression/persistance/growth in one month - Option for laparoscopy and cystectomy (possible oophorectomy) and excision of any endometriosis if found discussed in detail today and can be done anytime if pain persists or worsens, or if cyst persists or grows on follow up scan. - Risks of endometriosis discussed, including pain and infertility. -  IUD in place, continue for now  A total of 15 minutes were spent face-to-face with the patient during this encounter and over half of that time dealt with counseling and coordination of care.  Annamarie Major, MD, [facility], [facility] 02/22/2018  10:19 AM

## 2018-03-07 ENCOUNTER — Other Ambulatory Visit: Payer: Self-pay | Admitting: Obstetrics & Gynecology

## 2018-03-07 DIAGNOSIS — N83201 Unspecified ovarian cyst, right side: Secondary | ICD-10-CM

## 2018-03-20 ENCOUNTER — Ambulatory Visit: Payer: Self-pay

## 2018-03-22 ENCOUNTER — Other Ambulatory Visit: Payer: 59

## 2018-03-22 ENCOUNTER — Ambulatory Visit: Payer: 59 | Admitting: Obstetrics & Gynecology

## 2018-04-13 ENCOUNTER — Ambulatory Visit (INDEPENDENT_AMBULATORY_CARE_PROVIDER_SITE_OTHER): Payer: 59

## 2018-04-13 ENCOUNTER — Ambulatory Visit (INDEPENDENT_AMBULATORY_CARE_PROVIDER_SITE_OTHER): Payer: 59 | Admitting: Obstetrics & Gynecology

## 2018-04-13 ENCOUNTER — Other Ambulatory Visit: Payer: Self-pay

## 2018-04-13 ENCOUNTER — Other Ambulatory Visit: Payer: 59

## 2018-04-13 ENCOUNTER — Encounter: Payer: Self-pay | Admitting: Obstetrics & Gynecology

## 2018-04-13 VITALS — BP 114/80 | Ht 64.0 in | Wt 182.0 lb

## 2018-04-13 DIAGNOSIS — R1031 Right lower quadrant pain: Secondary | ICD-10-CM

## 2018-04-13 DIAGNOSIS — N83201 Unspecified ovarian cyst, right side: Secondary | ICD-10-CM

## 2018-04-13 NOTE — Progress Notes (Signed)
HPI: Pt is a 28 yo G1P0010 with continued RLQ pain associated with her periods.  Her periods are lighter w the IUD, which is an improvement.  Pain is mostly w her periods.  Periods last 5 days.  Known right complex ovarian cyst; was 4x4cm last visit 02/22/2018 (also simple 2x2cm cyst).  Does not desire fertility yet.  Ultrasound demonstrates cyst seen - see below These findings are c/w endometriosis related cyst, possible hemorrhagic.  PMHx: She  has a past medical history of Anemia. Also,  has no past surgical history on file., family history is not on file. She was adopted.,  reports that she has never smoked. She has never used smokeless tobacco. She reports previous alcohol use. She reports that she does not use drugs.  She has a current medication list which includes the following prescription(s): levonorgestrel. Also, has No Known Allergies.  Review of Systems  All other systems reviewed and are negative.   Objective: BP 114/80    Ht 5\' 4"  (1.626 m)    Wt 182 lb (82.6 kg)    LMP 03/18/2018 (Approximate)    BMI 31.24 kg/m   Physical examination Constitutional NAD, Conversant  Skin No rashes, lesions or ulceration.   Extremities: Moves all appropriately.  Normal ROM for age. No lymphadenopathy.  Neuro: Grossly intact  Psych: Oriented to PPT.  Normal mood. Normal affect.   [place] Pelvis Transvanginal Non-ob (tv Only)  Result Date: 04/13/2018 ULTRASOUND REPORT Location: [facility] Date of Service: 04/13/2018 Patient Name: Mary Diaz DOB: 16-Jul-1990 MRN: [id number] Indications:FOLLOW UP RIGHT OVARY CYST Findings: The uterus is anteverted and measures 8.49 x 5.08 x 3.27 cm. Echo texture is homogenous without evidence of focal masses. The Endometrium measures 9.12 mm. - IUD seen in right place. Right Ovary measures 6.68 x 7.46 x 5.37 cm. It contain 1) Cystic area possible simple cyst vs other etiology measures 4.95 x 4.11 x 4.78, and 2 ) Isoechoic area measures 2.13 x 1.57 x 1.54 cm  Left Ovary measures 4.55 x 2.08 x 1.39 cm. It is normal in appearance. Survey of the adnexa demonstrates no adnexal masses. There is no free fluid in the cul de sac. Impression: 1.IUD seen in right place. 2. Right ovary contain 1) Cystic area possible simple cyst vs other etiology measures 4.95 x 4.11 x 4.78, and 2 ) Isoechoic area measures 2.13 x 1.57 x 1.54 cm Recommendations: 1.Clinical correlation with the patient's History and Physical Exam. Mital bahen Leodis BinetP Patel, RDMS Review of ULTRASOUND.    I have personally reviewed images and report [facility].    Plan of management to be discussed with patient. Annamarie MajorPaul Vivyan Biggers, MD, FACOG [facility], [facility] 04/13/2018  8:47 AM    Assessment:  Right ovarian cyst  RLQ abdominal pain  Options for laparoscopy and ovarian cystectomy discussed, also to alleviate and diagnose endometriosis. Will cont to monitor the pain for now, as it is not severe and is not suggestive of torsion.  Unable to do elective cases at this time due to the [place].  Sx's to watch for due to worsening discussed.  May warrant emergent surgery if torsion exists.  Future plan for fertility, would repeat scan and consider laparoscopy if still present and growing prior to removing IUD and attempting conception.  Cont IUD for period control as well.  Yearly due in Aug 2020, PAP.  A total of 15 minutes were spent face-to-face with the patient during this encounter  and over half of that time dealt with counseling and coordination of care.  Annamarie Major, MD, [facility], [facility] 04/13/2018  8:59 AM

## 2018-05-28 ENCOUNTER — Encounter: Payer: Self-pay | Admitting: Emergency Medicine

## 2018-05-28 ENCOUNTER — Emergency Department: Payer: 59

## 2018-05-28 ENCOUNTER — Emergency Department
Admission: EM | Admit: 2018-05-28 | Discharge: 2018-05-28 | Disposition: A | Discharge status: Home / Self Care | Payer: 59 | Attending: Emergency Medicine | Admitting: Emergency Medicine

## 2018-05-28 ENCOUNTER — Other Ambulatory Visit: Payer: Self-pay

## 2018-05-28 DIAGNOSIS — R1031 Right lower quadrant pain: Secondary | ICD-10-CM

## 2018-05-28 DIAGNOSIS — Z79899 Other long term (current) drug therapy: Secondary | ICD-10-CM | POA: Insufficient documentation

## 2018-05-28 DIAGNOSIS — R52 Pain, unspecified: Secondary | ICD-10-CM

## 2018-05-28 LAB — URINALYSIS, COMPLETE (UACMP) WITH MICROSCOPIC
Bacteria, UA: NONE SEEN
Bilirubin Urine: NEGATIVE
Glucose, UA: NEGATIVE mg/dL
Ketones, ur: NEGATIVE mg/dL
Leukocytes,Ua: NEGATIVE
Nitrite: NEGATIVE
Protein, ur: NEGATIVE mg/dL
Specific Gravity, Urine: 1.019 (ref 1.005–1.030)
pH: 7 (ref 5.0–8.0)

## 2018-05-28 LAB — COMPREHENSIVE METABOLIC PANEL
ALT: 27 U/L (ref 0–44)
AST: 24 U/L (ref 15–41)
Albumin: 4.6 g/dL (ref 3.5–5.0)
Alkaline Phosphatase: 83 U/L (ref 38–126)
Anion gap: 8 (ref 5–15)
BUN: 13 mg/dL (ref 6–20)
CO2: 27 mmol/L (ref 22–32)
Calcium: 9.3 mg/dL (ref 8.9–10.3)
Chloride: 105 mmol/L (ref 98–111)
Creatinine, Ser: 0.63 mg/dL (ref 0.44–1.00)
GFR calc Af Amer: >60 mL/min (ref >60)
GFR calc non Af Amer: >60 mL/min (ref >60)
Glucose, Bld: 106 mg/dL — ABNORMAL HIGH (ref 70–99)
Potassium: 3.3 mmol/L — ABNORMAL LOW (ref 3.5–5.1)
Sodium: 140 mmol/L (ref 135–145)
Total Bilirubin: 0.4 mg/dL (ref 0.3–1.2)
Total Protein: 7.5 g/dL (ref 6.5–8.1)

## 2018-05-28 LAB — CBC
HCT: 41.7 % (ref 36.0–46.0)
Hemoglobin: 13.8 g/dL (ref 12.0–15.0)
MCH: 29.4 pg (ref 26.0–34.0)
MCHC: 33.1 g/dL (ref 30.0–36.0)
MCV: 88.7 fL (ref 80.0–100.0)
Platelets: 269 10*3/uL (ref 150–400)
RBC: 4.7 MIL/uL (ref 3.87–5.11)
RDW: 12.2 % (ref 11.5–15.5)
WBC: 8.5 10*3/uL (ref 4.0–10.5)
nRBC: 0 % (ref 0.0–0.2)

## 2018-05-28 LAB — POCT PREGNANCY, URINE: Preg Test, Ur: NEGATIVE

## 2018-05-28 LAB — LIPASE, BLOOD: Lipase: 40 U/L (ref 11–51)

## 2018-05-28 NOTE — ED Notes (Signed)
Pt reports she has a cyst on her right ovary. Around 3 am she was awakened from sleep with pain that was worse than her normal pain to her right lower abd. Took Ibuprofen but no relief. Was concerned about worsening complications so came to be checked out.

## 2018-05-28 NOTE — Discharge Instructions (Addendum)
The ultrasound shows that the cyst is slightly smaller.  The pain may have been due to it leaking a little bit.  It is still very large however.  It could still twist.  If the pain gets worse or if you get a fever nausea or vomiting please come back.  Please also give your OB/GYN doctor call.  They are beginning to do surgeries again.  It may be that you could have your surgery rescheduled.

## 2018-05-28 NOTE — ED Provider Notes (Signed)
[facility] Emergency Department Provider Note   ____________________________________________   First MD Initiated Contact with Patient 05/28/18 0715     (approximate)  I have reviewed the triage vital signs and the nursing notes.   HISTORY  Chief Complaint Abdominal Pain   HPI Mary Diaz is a 28 y.o. female who reports she was woken up in the middle of the night about 3:00 in the morning by severe crampy right lower quadrant pain.  She has had a cyst there that is cause pain of this nature in the past but this pain was a little worse.  She did not have any fever.  She came into the emergency room had an ultrasound which showed the cyst that shrunk slightly.  There is good blood flow to the cyst.  Pain is now much better.  She does not want any pain medicine at this point.  She was going to have surgery can have the cyst removed but it was postponed due to the COVID pandemic.  I let her know that we are resuming surgeries is possible maybe she can have this taken out.         Past Medical History:  Diagnosis Date   Anemia     Patient Active Problem List   Diagnosis Date Noted   Right ovarian cyst 02/22/2018   RLQ abdominal pain 02/22/2018   Menorrhagia with irregular cycle 07/28/2017   Iron deficiency anemia due to chronic blood loss 07/28/2017    History reviewed. No pertinent surgical history.  Prior to Admission medications   Medication Sig Start Date End Date Taking? Authorizing Provider  Levonorgestrel (KYLEENA) 19.5 MG IUD by Intrauterine route.    [provider]    Allergies Patient has no known allergies.  Family History  Adopted: Yes    Social History Social History   Tobacco Use   Smoking status: Never Smoker   Smokeless tobacco: Never Used  Substance Use Topics   Alcohol use: Not Currently    Frequency: Never   Drug use: Never    Review of Systems  Constitutional: No fever/chills Eyes:  No visual changes. ENT: No sore throat. Cardiovascular: Denies chest pain. Respiratory: Denies shortness of breath. Gastrointestinal:  abdominal pain.  No nausea, no vomiting.  No diarrhea.  No constipation. Genitourinary: Negative for dysuria. Musculoskeletal: Negative for back pain. Skin: Negative for rash. Neurological: Negative for headaches, focal weakness   ____________________________________________   PHYSICAL EXAM:  VITAL SIGNS: ED Triage Vitals  Enc Vitals Group     BP 05/28/18 0410 120/81     Pulse Rate 05/28/18 0410 84     Resp 05/28/18 0410 20     Temp 05/28/18 0410 98 F (36.7 C)     Temp Source 05/28/18 0410 Oral     SpO2 05/28/18 0410 97 %     Weight 05/28/18 0408 175 lb (79.4 kg)     Height 05/28/18 0408 5\' 4"  (1.626 m)     Head Circumference --      Peak Flow --      Pain Score 05/28/18 0407 8     Pain Loc --      Pain Edu? --      Excl. in GC? --     Constitutional: Alert and oriented. Well appearing and in no acute distress. Eyes: Conjunctivae are normal.. Head: Atraumatic. Nose: No congestion/rhinnorhea. Mouth/Throat: Mucous membranes are moist.  Oropharynx non-erythematous. Neck: No stridor. Cardiovascular: Normal rate, regular rhythm. Grossly normal  heart sounds.  Good peripheral circulation. Respiratory: Normal respiratory effort.  No retractions. Lungs CTAB. Gastrointestinal: Soft there is still some right lower quadrant pain to palpation.  No distention. No abdominal bruits. No CVA tenderness. Genitourinary: Deferred Musculoskeletal: No lower extremity tenderness nor edema.   Neurologic:  Normal speech and language. No gross focal neurologic deficits are appreciated. No gait instability. Skin:  Skin is warm, dry and intact. No rash noted.  ____________________________________________   LABS (all labs ordered are listed, but only abnormal results are displayed)  Labs Reviewed  COMPREHENSIVE METABOLIC PANEL - Abnormal; Notable for the  following components:      Result Value   Potassium 3.3 (*)    Glucose, Bld 106 (*)    All other components within normal limits  URINALYSIS, COMPLETE (UACMP) WITH MICROSCOPIC - Abnormal; Notable for the following components:   Color, Urine YELLOW (*)    APPearance HAZY (*)    Hgb urine dipstick MODERATE (*)    All other components within normal limits  LIPASE, BLOOD  CBC  POC URINE PREG, ED  POCT PREGNANCY, URINE   ____________________________________________  EKG   ____________________________________________  RADIOLOGY  ED MD interpretation: Ultrasound shows complex cyst.  It is smaller than previously.  There is good blood flow to it.  Official radiology report(s): US Pelvis Transvanginal Non-ob (tv Only)  Result Date: 05/28/2018 CLINICAL DATA:  28 y/o F; right adnexal and right lower quadrant pain. History of right ovarian cyst. EXAM: TRANSABDOMINAL AND TRANSVAGINAL ULTRASOUND OF PELVIS DOPPLER ULTRASOUND OF OVARIES TECHNIQUE: Both transabdominal and transvaginal ultrasound examinations of the pelvis were performed. Transabdominal technique was performed for global imaging of the pelvis including uterus, ovaries, adnexal regions, and pelvic cul-de-sac. It was necessary to proceed with endovaginal exam following the transabdominal exam to visualize the adnexa. Color and duplex Doppler ultrasound was utilized to evaluate blood flow to the ovaries. COMPARISON:  04/13/2018 and 02/22/2018 pelvic ultrasound. FINDINGS: Uterus Measurements: 8.3 x 3.5 x 4.5 cm = volume: 69.1 mL. No fibroids or other mass visualized. Endometrium Thickness: 4 mm. No focal abnormality visualized. IUD well seated within the uterine fundus. Right ovary Measurements: 5.4 x 5.6 x 5.4 cm = volume: 86.3 mL. Right ovarian cystic mass measuring 4.3 x 3.7 x 4.1 cm (volume = 34 cm^3), previously 50.9 cc. Cyst contents are heterogeneously echogenic and demonstrate a debris fluid level. No blood flow within the mass on  color Doppler. Left ovary Measurements: 2.8 x 2.0 x 1.9 cm = volume: 5.4 mL. Normal appearance/no adnexal mass. Pulsed Doppler evaluation of both ovaries demonstrates normal low-resistance arterial and venous waveforms. Other findings No abnormal free fluid. IMPRESSION: 1. No acute process identified. 2. Right ovary complex cyst is decreased in size currently measuring 34 cc, previously 50.9 cc. Persistence over several months favors endometrioma or hemorrhagic cyst, however, the lesion does appear to be decreasing in size. Continued ultrasound follow-up recommended. Electronically Signed   By: Mitzi Hansen M.D.   On: 05/28/2018 06:19   US Pelvis Complete  Result Date: 05/28/2018 CLINICAL DATA:  28 y/o F; right adnexal and right lower quadrant pain. History of right ovarian cyst. EXAM: TRANSABDOMINAL AND TRANSVAGINAL ULTRASOUND OF PELVIS DOPPLER ULTRASOUND OF OVARIES TECHNIQUE: Both transabdominal and transvaginal ultrasound examinations of the pelvis were performed. Transabdominal technique was performed for global imaging of the pelvis including uterus, ovaries, adnexal regions, and pelvic cul-de-sac. It was necessary to proceed with endovaginal exam following the transabdominal exam to visualize the adnexa. Color and duplex Doppler  ultrasound was utilized to evaluate blood flow to the ovaries. COMPARISON:  04/13/2018 and 02/22/2018 pelvic ultrasound. FINDINGS: Uterus Measurements: 8.3 x 3.5 x 4.5 cm = volume: 69.1 mL. No fibroids or other mass visualized. Endometrium Thickness: 4 mm. No focal abnormality visualized. IUD well seated within the uterine fundus. Right ovary Measurements: 5.4 x 5.6 x 5.4 cm = volume: 86.3 mL. Right ovarian cystic mass measuring 4.3 x 3.7 x 4.1 cm (volume = 34 cm^3), previously 50.9 cc. Cyst contents are heterogeneously echogenic and demonstrate a debris fluid level. No blood flow within the mass on color Doppler. Left ovary Measurements: 2.8 x 2.0 x 1.9 cm = volume:  5.4 mL. Normal appearance/no adnexal mass. Pulsed Doppler evaluation of both ovaries demonstrates normal low-resistance arterial and venous waveforms. Other findings No abnormal free fluid. IMPRESSION: 1. No acute process identified. 2. Right ovary complex cyst is decreased in size currently measuring 34 cc, previously 50.9 cc. Persistence over several months favors endometrioma or hemorrhagic cyst, however, the lesion does appear to be decreasing in size. Continued ultrasound follow-up recommended. Electronically Signed   By: Mitzi Hansen M.D.   On: 05/28/2018 06:19   US Pelvic Doppler (torsion R/o Or Mass Arterial Flow)  Result Date: 05/28/2018 CLINICAL DATA:  28 y/o F; right adnexal and right lower quadrant pain. History of right ovarian cyst. EXAM: TRANSABDOMINAL AND TRANSVAGINAL ULTRASOUND OF PELVIS DOPPLER ULTRASOUND OF OVARIES TECHNIQUE: Both transabdominal and transvaginal ultrasound examinations of the pelvis were performed. Transabdominal technique was performed for global imaging of the pelvis including uterus, ovaries, adnexal regions, and pelvic cul-de-sac. It was necessary to proceed with endovaginal exam following the transabdominal exam to visualize the adnexa. Color and duplex Doppler ultrasound was utilized to evaluate blood flow to the ovaries. COMPARISON:  04/13/2018 and 02/22/2018 pelvic ultrasound. FINDINGS: Uterus Measurements: 8.3 x 3.5 x 4.5 cm = volume: 69.1 mL. No fibroids or other mass visualized. Endometrium Thickness: 4 mm. No focal abnormality visualized. IUD well seated within the uterine fundus. Right ovary Measurements: 5.4 x 5.6 x 5.4 cm = volume: 86.3 mL. Right ovarian cystic mass measuring 4.3 x 3.7 x 4.1 cm (volume = 34 cm^3), previously 50.9 cc. Cyst contents are heterogeneously echogenic and demonstrate a debris fluid level. No blood flow within the mass on color Doppler. Left ovary Measurements: 2.8 x 2.0 x 1.9 cm = volume: 5.4 mL. Normal appearance/no adnexal  mass. Pulsed Doppler evaluation of both ovaries demonstrates normal low-resistance arterial and venous waveforms. Other findings No abnormal free fluid. IMPRESSION: 1. No acute process identified. 2. Right ovary complex cyst is decreased in size currently measuring 34 cc, previously 50.9 cc. Persistence over several months favors endometrioma or hemorrhagic cyst, however, the lesion does appear to be decreasing in size. Continued ultrasound follow-up recommended. Electronically Signed   By: Mitzi Hansen M.D.   On: 05/28/2018 06:19    ____________________________________________   PROCEDURES  Procedure(s) performed (including Critical Care):  Procedures   ____________________________________________   INITIAL IMPRESSION / ASSESSMENT AND PLAN / ED COURSE Patient feels much better.  Discussed with her the need to follow-up with her OB/GYN doctor and the fact that we may be out of do the surgery now.  She will return if worse.  She understands the cyst could still twist.  She also understands that the appendix is down in that area and although this does not appear to be appendicitis at all if she develops gradually onset of pain getting worse with any fever vomiting she will  return for that as well.             ____________________________________________   FINAL CLINICAL IMPRESSION(S) / ED DIAGNOSES  Final diagnoses:  Pain  Right lower quadrant abdominal pain     ED Discharge Orders    None       Note:  This document was prepared using Dragon voice recognition software and may include unintentional dictation errors.    Arnaldo Natal, MD 05/28/18 [phone]

## 2018-05-28 NOTE — ED Triage Notes (Signed)
Patient with right lower abdominal pain.  Patient with history of right ovarian cyst.

## 2018-05-31 ENCOUNTER — Telehealth: Payer: Self-pay

## 2018-05-31 NOTE — Telephone Encounter (Signed)
Pt called c/o slight rupture of ovarian cyst; was seen in ED; ED provider adv her to call us this week to see if surgery could be done now instead of waiting until Aug as elective surgeries are now being done again.  [phone]

## 2018-05-31 NOTE — Telephone Encounter (Signed)
Sch appt to be preop as well as re-eval once surgery can be scheduled in June for painful cyst  Surgery Booking Request Patient Full Name:  Mary Diaz  MRN: [id number]  DOB: 21-Jan-1990  Surgeon: Letitia Libra, MD  Requested Surgery Date and Time: June 2020 Primary Diagnosis AND Code: RLQ pain, Complex ovarian cyst Secondary Diagnosis and Code: Endometriosis Surgical Procedure: Laparoscopy Ovarian cystectomy L&D Notification: No Admission Status: same day surgery Length of Surgery: 1 hr Special Case Needs: no H&P: yes (date) Phone Interview???: yes Interpreter: Language:  Medical Clearance: no Special Scheduling Instructions: no

## 2018-06-05 NOTE — Telephone Encounter (Signed)
Patient is aware of H&P at [facility] on 06/26/18 @ 3:30pm w/ Dr. Tiburcio Pea, Pre-admit Testing and COVID testing to be scheduled, and OR on 07/04/18. Patient is aware she will be asked to quarantine after COVID testing. Patient is aware of phone check-in/ mask/ no visitors at [facility].

## 2018-06-05 NOTE — Telephone Encounter (Signed)
Can I complete this?

## 2018-06-26 ENCOUNTER — Encounter: Payer: Self-pay | Admitting: Obstetrics & Gynecology

## 2018-06-26 ENCOUNTER — Other Ambulatory Visit: Payer: Self-pay

## 2018-06-26 ENCOUNTER — Ambulatory Visit (INDEPENDENT_AMBULATORY_CARE_PROVIDER_SITE_OTHER): Payer: 59 | Admitting: Obstetrics & Gynecology

## 2018-06-26 VITALS — BP 120/80 | Ht 64.0 in | Wt 181.0 lb

## 2018-06-26 DIAGNOSIS — R1031 Right lower quadrant pain: Secondary | ICD-10-CM

## 2018-06-26 DIAGNOSIS — N83201 Unspecified ovarian cyst, right side: Secondary | ICD-10-CM

## 2018-06-26 NOTE — Patient Instructions (Signed)
Diagnostic Laparoscopy, Care After  This sheet gives you information about how to care for yourself after your procedure. Your health care provider may also give you more specific instructions. If you have problems or questions, contact your health care provider.  What can I expect after the procedure?  After the procedure, it is common to have:   Mild discomfort in the abdomen.   Sore throat.  Women who have laparoscopy with pelvic examination may have mild cramping and fluid coming from the vagina for a few days after the procedure.  Follow these instructions at home:  Medicines   Take over-the-counter and prescription medicines only as told by your health care provider.   If you were prescribed an antibiotic medicine, take it as told by your health care provider. Do not stop taking the antibiotic even if you start to feel better.  Driving   Do not drive for 24 hours if you were given a medicine to help you relax (sedative) during your procedure.   Do not drive or use heavy machinery while taking prescription pain medicine.  Bathing   Do not take baths, swim, or use a hot tub until your health care provider approves. You may take showers.  Incision care     Follow instructions from your health care provider about how to take care of your incisions. Make sure you:  ? Wash your hands with soap and water before you change your bandage (dressing). If soap and water are not available, use hand sanitizer.  ? Change your dressing as told by your health care provider.  ? Leave stitches (sutures), skin glue, or adhesive strips in place. These skin closures may need to stay in place for 2 weeks or longer. If adhesive strip edges start to loosen and curl up, you may trim the loose edges. Do not remove adhesive strips completely unless your health care provider tells you to do that.   Check your incision areas every day for signs of infection. Check for:  ? Redness, swelling, or pain.  ? Fluid or  blood.  ? Warmth.  ? Pus or a bad smell.  Activity   Return to your normal activities as told by your health care provider. Ask your health care provider what activities are safe for you.   Do not lift anything that is heavier than 10 lb (4.5 kg), or the limit that you are told, until your health care provider says that it is safe.  General instructions   To prevent or treat constipation while you are taking prescription pain medicine, your health care provider may recommend that you:  ? Drink enough fluid to keep your urine pale yellow.  ? Take over-the-counter or prescription medicines.  ? Eat foods that are high in fiber, such as fresh fruits and vegetables, whole grains, and beans.  ? Limit foods that are high in fat and processed sugars, such as fried and sweet foods.   Do not use any products that contain nicotine or tobacco, such as cigarettes and e-cigarettes. If you need help quitting, ask your health care provider.   Keep all follow-up visits as told by your health care provider. This is important.  Contact a health care provider if:   You develop shoulder pain.   You feel lightheaded or faint.   You are unable to pass gas or have a bowel movement.   You feel nauseous or you vomit.   You develop a rash.   You have redness, swelling,   or pain around any incision.   You have fluid or blood coming from any incision.   Any incision feels warm to the touch.   You have pus or a bad smell coming from any incision.   You have a fever or chills.  Get help right away if:   You have severe pain.   You have vomiting that does not go away.   You have heavy bleeding from the vagina.   Any incision opens.   You have trouble breathing.   You have chest pain.  Summary   After the procedure, it is common to have mild discomfort in the abdomen and a sore throat.   Check your incision areas every day for signs of infection.   Return to your normal activities as told by your health care provider. Ask  your health care provider what activities are safe for you.  This information is not intended to replace advice given to you by your health care provider. Make sure you discuss any questions you have with your health care provider.  Document Released: 12/02/2014 Document Revised: 06/16/2016 Document Reviewed: 06/16/2016  Elsevier Interactive Patient Education  2019 [employer]

## 2018-06-26 NOTE — Progress Notes (Signed)
PRE-OPERATIVE HISTORY AND PHYSICAL EXAM  HPI:  Mary Diaz is a 28 y.o. G1P0010 Patient's last menstrual period was 06/25/2018.; she is being admitted for surgery related to adnexal mass and pelvic pain.   RLQ pain is associated with her periods.  Her periods are lighter w the IUD, which is an improvement.  Pain is mostly w her periods.  Periods last 5 days.  Known right complex ovarian cyst; was 4x4cm prior visit 02/22/2018 (also simple 2x2cm cyst).  Then 5 cm in April, 4 cm again in May.  Pain is not letting up. Does not desire fertility yet.  PMHx: Past Medical History:  Diagnosis Date  . Anemia    History reviewed. No pertinent surgical history. Family History  Adopted: Yes   Social History   Tobacco Use  . Smoking status: Never Smoker  . Smokeless tobacco: Never Used  Substance Use Topics  . Alcohol use: Not Currently    Frequency: Never  . Drug use: Never    Current Outpatient Medications:  .  ferrous sulfate 325 (65 FE) MG tablet, Take 325 mg by mouth daily with breakfast., Disp: , Rfl:  .  Levonorgestrel (KYLEENA) 19.5 MG IUD, 19.5 mg by Intrauterine route once. , Disp: , Rfl:  Allergies: Patient has no known allergies.  Review of Systems  Constitutional: Negative for chills, fever and malaise/fatigue.  HENT: Negative for congestion, sinus pain and sore throat.   Eyes: Negative for blurred vision and pain.  Respiratory: Negative for cough and wheezing.   Cardiovascular: Negative for chest pain and leg swelling.  Gastrointestinal: Negative for abdominal pain, constipation, diarrhea, heartburn, nausea and vomiting.  Genitourinary: Negative for dysuria, frequency, hematuria and urgency.  Musculoskeletal: Negative for back pain, joint pain, myalgias and neck pain.  Skin: Negative for itching and rash.  Neurological: Negative for dizziness, tremors and weakness.  Endo/Heme/Allergies: Does not bruise/bleed easily.  Psychiatric/Behavioral: Negative for  depression. The patient is not nervous/anxious and does not have insomnia.     Objective: BP 120/80   Ht 5\' 4"  (1.626 m)   Wt 181 lb (82.1 kg)   LMP 06/25/2018   BMI 31.07 kg/m   Filed Weights   06/26/18 1529  Weight: 181 lb (82.1 kg)   Physical Exam Constitutional:      General: She is not in acute distress.    Appearance: She is well-developed.  HENT:     Head: Normocephalic and atraumatic. No laceration.     Right Ear: Hearing normal.     Left Ear: Hearing normal.     Mouth/Throat:     Pharynx: Uvula midline.  Eyes:     Pupils: Pupils are equal, round, and reactive to light.  Neck:     Musculoskeletal: Normal range of motion and neck supple.     Thyroid: No thyromegaly.  Cardiovascular:     Rate and Rhythm: Normal rate and regular rhythm.     Heart sounds: No murmur. No friction rub. No gallop.   Pulmonary:     Effort: Pulmonary effort is normal. No respiratory distress.     Breath sounds: Normal breath sounds. No wheezing.  Chest:     Breasts:        Right: No mass, skin change or tenderness.        Left: No mass, skin change or tenderness.  Abdominal:     General: Bowel sounds are normal. There is no distension.     Palpations: Abdomen is soft.  Tenderness: There is no abdominal tenderness. There is no rebound.  Musculoskeletal: Normal range of motion.  Neurological:     Mental Status: She is alert and oriented to person, place, and time.     Cranial Nerves: No cranial nerve deficit.  Skin:    General: Skin is warm and dry.  Psychiatric:        Judgment: Judgment normal.  Vitals signs reviewed.     Assessment: 1. Right ovarian cyst   2. RLQ abdominal pain   Plan laparoscopy with ovarian cystectomy (probable right sided) Evaluation for endometriosis as well  I have had a careful discussion with this patient about all the options available and the risk/benefits of each. I have fully informed this patient that surgery may subject her to a variety of  discomforts and risks: She understands that most patients have surgery with little difficulty, but problems can happen ranging from minor to fatal. These include nausea, vomiting, pain, bleeding, infection, poor healing, hernia, or formation of adhesions. Unexpected reactions may occur from any drug or anesthetic given. Unintended injury may occur to other pelvic or abdominal structures such as Fallopian tubes, ovaries, bladder, ureter (tube from kidney to bladder), or bowel. Nerves going from the pelvis to the legs may be injured. Any such injury may require immediate or later additional surgery to correct the problem. Excessive blood loss requiring transfusion is very unlikely but possible. Dangerous blood clots may form in the legs or lungs. Physical and sexual activity will be restricted in varying degrees for an indeterminate period of time but most often 2-6 weeks.  Finally, she understands that it is impossible to list every possible undesirable effect and that the condition for which surgery is done is not always cured or significantly improved, and in rare cases may be even worse.Ample time was given to answer all questions.  Barnett Applebaum, MD, [facility] 06/26/2018  3:51 PM

## 2018-06-26 NOTE — H&P (View-Only) (Signed)
   PRE-OPERATIVE HISTORY AND PHYSICAL EXAM  HPI:  Mary Diaz is a 27 y.o. G1P0010 Patient's last menstrual period was 06/25/2018.; she is being admitted for surgery related to adnexal mass and pelvic pain.   RLQ pain is associated with her periods.  Her periods are lighter w the IUD, which is an improvement.  Pain is mostly w her periods.  Periods last 5 days.  Known right complex ovarian cyst; was 4x4cm prior visit 02/22/2018 (also simple 2x2cm cyst).  Then 5 cm in April, 4 cm again in May.  Pain is not letting up. Does not desire fertility yet.  PMHx: Past Medical History:  Diagnosis Date  . Anemia    History reviewed. No pertinent surgical history. Family History  Adopted: Yes   Social History   Tobacco Use  . Smoking status: Never Smoker  . Smokeless tobacco: Never Used  Substance Use Topics  . Alcohol use: Not Currently    Frequency: Never  . Drug use: Never    Current Outpatient Medications:  .  ferrous sulfate 325 (65 FE) MG tablet, Take 325 mg by mouth daily with breakfast., Disp: , Rfl:  .  Levonorgestrel (KYLEENA) 19.5 MG IUD, 19.5 mg by Intrauterine route once. , Disp: , Rfl:  Allergies: Patient has no known allergies.  Review of Systems  Constitutional: Negative for chills, fever and malaise/fatigue.  HENT: Negative for congestion, sinus pain and sore throat.   Eyes: Negative for blurred vision and pain.  Respiratory: Negative for cough and wheezing.   Cardiovascular: Negative for chest pain and leg swelling.  Gastrointestinal: Negative for abdominal pain, constipation, diarrhea, heartburn, nausea and vomiting.  Genitourinary: Negative for dysuria, frequency, hematuria and urgency.  Musculoskeletal: Negative for back pain, joint pain, myalgias and neck pain.  Skin: Negative for itching and rash.  Neurological: Negative for dizziness, tremors and weakness.  Endo/Heme/Allergies: Does not bruise/bleed easily.  Psychiatric/Behavioral: Negative for  depression. The patient is not nervous/anxious and does not have insomnia.     Objective: BP 120/80   Ht 5' 4" (1.626 m)   Wt 181 lb (82.1 kg)   LMP 06/25/2018   BMI 31.07 kg/m   Filed Weights   06/26/18 1529  Weight: 181 lb (82.1 kg)   Physical Exam Constitutional:      General: She is not in acute distress.    Appearance: She is well-developed.  HENT:     Head: Normocephalic and atraumatic. No laceration.     Right Ear: Hearing normal.     Left Ear: Hearing normal.     Mouth/Throat:     Pharynx: Uvula midline.  Eyes:     Pupils: Pupils are equal, round, and reactive to light.  Neck:     Musculoskeletal: Normal range of motion and neck supple.     Thyroid: No thyromegaly.  Cardiovascular:     Rate and Rhythm: Normal rate and regular rhythm.     Heart sounds: No murmur. No friction rub. No gallop.   Pulmonary:     Effort: Pulmonary effort is normal. No respiratory distress.     Breath sounds: Normal breath sounds. No wheezing.  Chest:     Breasts:        Right: No mass, skin change or tenderness.        Left: No mass, skin change or tenderness.  Abdominal:     General: Bowel sounds are normal. There is no distension.     Palpations: Abdomen is soft.       Tenderness: There is no abdominal tenderness. There is no rebound.  Musculoskeletal: Normal range of motion.  Neurological:     Mental Status: She is alert and oriented to person, place, and time.     Cranial Nerves: No cranial nerve deficit.  Skin:    General: Skin is warm and dry.  Psychiatric:        Judgment: Judgment normal.  Vitals signs reviewed.     Assessment: 1. Right ovarian cyst   2. RLQ abdominal pain   Plan laparoscopy with ovarian cystectomy (probable right sided) Evaluation for endometriosis as well  I have had a careful discussion with this patient about all the options available and the risk/benefits of each. I have fully informed this patient that surgery may subject her to a variety of  discomforts and risks: She understands that most patients have surgery with little difficulty, but problems can happen ranging from minor to fatal. These include nausea, vomiting, pain, bleeding, infection, poor healing, hernia, or formation of adhesions. Unexpected reactions may occur from any drug or anesthetic given. Unintended injury may occur to other pelvic or abdominal structures such as Fallopian tubes, ovaries, bladder, ureter (tube from kidney to bladder), or bowel. Nerves going from the pelvis to the legs may be injured. Any such injury may require immediate or later additional surgery to correct the problem. Excessive blood loss requiring transfusion is very unlikely but possible. Dangerous blood clots may form in the legs or lungs. Physical and sexual activity will be restricted in varying degrees for an indeterminate period of time but most often 2-6 weeks.  Finally, she understands that it is impossible to list every possible undesirable effect and that the condition for which surgery is done is not always cured or significantly improved, and in rare cases may be even worse.Ample time was given to answer all questions.  Barnett Applebaum, MD, [facility] 06/26/2018  3:51 PM

## 2018-06-27 ENCOUNTER — Encounter
Admission: RE | Admit: 2018-06-27 | Discharge: 2018-06-27 | Disposition: A | Discharge status: Home / Self Care | Payer: 59 | Source: Ambulatory Visit | Attending: Obstetrics & Gynecology | Admitting: Obstetrics & Gynecology

## 2018-06-27 ENCOUNTER — Other Ambulatory Visit: Payer: Self-pay

## 2018-06-27 DIAGNOSIS — Z1159 Encounter for screening for other viral diseases: Secondary | ICD-10-CM | POA: Diagnosis not present

## 2018-06-27 DIAGNOSIS — Z01812 Encounter for preprocedural laboratory examination: Secondary | ICD-10-CM | POA: Diagnosis present

## 2018-06-27 DIAGNOSIS — R1031 Right lower quadrant pain: Secondary | ICD-10-CM | POA: Insufficient documentation

## 2018-06-27 DIAGNOSIS — N83201 Unspecified ovarian cyst, right side: Secondary | ICD-10-CM | POA: Diagnosis not present

## 2018-06-27 DIAGNOSIS — N801 Endometriosis of ovary: Secondary | ICD-10-CM | POA: Insufficient documentation

## 2018-06-27 NOTE — Patient Instructions (Signed)
Your procedure is scheduled on: Tues.  6/30 Report to Day Surgery. To find out your arrival time please call [phone] between 1PM - 3PM on Mon 6/29.  Remember: Instructions that are not followed completely may result in serious medical risk,  up to and including death, or upon the discretion of your surgeon and anesthesiologist your  surgery may need to be rescheduled.     _X__ 1. Do not eat food after midnight the night before your procedure.                 No gum chewing or hard candies. You may drink clear liquids up to 2 hours                 before you are scheduled to arrive for your surgery- DO not drink clear                 liquids within 2 hours of the start of your surgery.                 Clear Liquids include:  water, apple juice without pulp, clear carbohydrate                 drink such as Clearfast of Gatorade, Black Coffee or Tea (Do not add                 anything to coffee or tea).  __X__2.  On the morning of surgery brush your teeth with toothpaste and water, you                may rinse your mouth with mouthwash if you wish.  Do not swallow any toothpaste of mouthwash.     _X__ 3.  No Alcohol for 24 hours before or after surgery.   _X__ 4.  Do Not Smoke or use e-cigarettes For 24 Hours Prior to Your Surgery.                 Do not use any chewable tobacco products for at least 6 hours prior to                 surgery.  ____  5.  Bring all medications with you on the day of surgery if instructed.   _x___  6.  Notify your doctor if there is any change in your medical condition      (cold, fever, infections).     Do not wear jewelry, make-up, hairpins, clips or nail polish. Do not wear lotions, powders, or perfumes. You may wear deodorant. Do not shave 48 hours prior to surgery. Men may shave face and neck. Do not bring valuables to the hospital.    [facility] belongings or [facility]uables.  Contacts, dentures  or bridgework may not be worn into surgery. Leave your suitcase in the car. After surgery it may be brought to your room. For patients admitted to the hospital, discharge time is determined by your treatment team.   Patients discharged the day of surgery will not be allowed to drive home.   Please read over the following fact sheets that you were given:    __x__ Take these medicines the morning of surgery with A SIP OF WATER:    1.flonasse and tylenol if needed  2.   3.   4.  5.  6.  ____ Fleet Enema (as directed)   __x__ Use CHG Soap as directed  ____ Use inhalers  on the day of surgery  ____ Stop metformin 2 days prior to surgery    ____ Take 1/2 of usual insulin dose the night before surgery. No insulin the morning          of surgery.   ____ Stop Coumadin/Plavix/aspirin on   __x__ Stop Anti-inflammatories  Advil, Aleve or Aspirin       May take tylenol   ____ Stop supplements until after surgery.    ____ Bring C-Pap to the hospital.

## 2018-06-30 ENCOUNTER — Other Ambulatory Visit: Payer: Self-pay

## 2018-06-30 ENCOUNTER — Other Ambulatory Visit
Admission: RE | Admit: 2018-06-30 | Discharge: 2018-06-30 | Disposition: A | Discharge status: Home / Self Care | Payer: 59 | Source: Ambulatory Visit | Attending: Obstetrics & Gynecology | Admitting: Obstetrics & Gynecology

## 2018-06-30 DIAGNOSIS — Z01812 Encounter for preprocedural laboratory examination: Secondary | ICD-10-CM | POA: Diagnosis not present

## 2018-06-30 LAB — TYPE AND SCREEN
ABO/RH(D): AB POS
Antibody Screen: NEGATIVE

## 2018-07-01 LAB — NOVEL CORONAVIRUS, NAA (HOSP ORDER, SEND-OUT TO REF LAB; TAT 18-24 HRS): SARS-CoV-2, NAA: NOT DETECTED

## 2018-07-04 ENCOUNTER — Ambulatory Visit
Admission: RE | Admit: 2018-07-04 | Discharge: 2018-07-04 | Disposition: A | Discharge status: Home / Self Care | Payer: 59 | Attending: Obstetrics & Gynecology | Admitting: Obstetrics & Gynecology

## 2018-07-04 ENCOUNTER — Encounter
Admission: RE | Disposition: A | Discharge status: Home / Self Care | Payer: Self-pay | Source: Home / Self Care | Attending: Obstetrics & Gynecology

## 2018-07-04 ENCOUNTER — Ambulatory Visit: Payer: 59 | Admitting: Anesthesiology

## 2018-07-04 ENCOUNTER — Other Ambulatory Visit: Payer: Self-pay

## 2018-07-04 DIAGNOSIS — N803 Endometriosis of pelvic peritoneum: Secondary | ICD-10-CM | POA: Insufficient documentation

## 2018-07-04 DIAGNOSIS — N800 Endometriosis of uterus: Secondary | ICD-10-CM

## 2018-07-04 DIAGNOSIS — R1031 Right lower quadrant pain: Secondary | ICD-10-CM | POA: Diagnosis present

## 2018-07-04 DIAGNOSIS — Z793 Long term (current) use of hormonal contraceptives: Secondary | ICD-10-CM | POA: Insufficient documentation

## 2018-07-04 DIAGNOSIS — Z79899 Other long term (current) drug therapy: Secondary | ICD-10-CM | POA: Insufficient documentation

## 2018-07-04 DIAGNOSIS — R102 Pelvic and perineal pain: Secondary | ICD-10-CM | POA: Diagnosis not present

## 2018-07-04 DIAGNOSIS — N801 Endometriosis of ovary: Secondary | ICD-10-CM

## 2018-07-04 DIAGNOSIS — N8301 Follicular cyst of right ovary: Secondary | ICD-10-CM | POA: Insufficient documentation

## 2018-07-04 DIAGNOSIS — N809 Endometriosis, unspecified: Secondary | ICD-10-CM | POA: Diagnosis present

## 2018-07-04 DIAGNOSIS — N83201 Unspecified ovarian cyst, right side: Secondary | ICD-10-CM

## 2018-07-04 HISTORY — PX: LAPAROSCOPIC OVARIAN CYSTECTOMY: SHX6248

## 2018-07-04 LAB — POCT I-STAT 4, (NA,K, GLUC, HGB,HCT)
Glucose, Bld: 76 mg/dL (ref 70–99)
HCT: 40 % (ref 36.0–46.0)
Hemoglobin: 13.6 g/dL (ref 12.0–15.0)
Potassium: 3.8 mmol/L (ref 3.5–5.1)
Sodium: 137 mmol/L (ref 135–145)

## 2018-07-04 LAB — ABO/RH: ABO/RH(D): AB POS

## 2018-07-04 LAB — POCT PREGNANCY, URINE: Preg Test, Ur: NEGATIVE

## 2018-07-04 SURGERY — EXCISION, CYST, OVARY, LAPAROSCOPIC
Anesthesia: General | Laterality: Right

## 2018-07-04 MED ORDER — FAMOTIDINE 20 MG PO TABS
ORAL_TABLET | ORAL | Status: AC
  Filled 2018-07-04: qty 1

## 2018-07-04 MED ORDER — OXYCODONE-ACETAMINOPHEN 5-325 MG PO TABS: 1.0000 | ORAL_TABLET | ORAL | Status: DC | PRN

## 2018-07-04 MED ORDER — ACETAMINOPHEN 325 MG PO TABS: 650.0000 mg | ORAL_TABLET | ORAL | Status: DC | PRN

## 2018-07-04 MED ORDER — MIDAZOLAM HCL 2 MG/2ML IJ SOLN
INTRAMUSCULAR | Status: AC
  Filled 2018-07-04: qty 2

## 2018-07-04 MED ORDER — EPHEDRINE SULFATE 50 MG/ML IJ SOLN
INTRAMUSCULAR | Status: DC | PRN
  Administered 2018-07-04: 10 mg via INTRAVENOUS

## 2018-07-04 MED ORDER — LACTATED RINGERS IV SOLN: INTRAVENOUS | Status: DC

## 2018-07-04 MED ORDER — KETOROLAC TROMETHAMINE 30 MG/ML IJ SOLN
INTRAMUSCULAR | Status: AC
  Filled 2018-07-04: qty 1

## 2018-07-04 MED ORDER — LACTATED RINGERS IV SOLN
INTRAVENOUS | Status: DC
  Administered 2018-07-04: 09:00:00 via INTRAVENOUS

## 2018-07-04 MED ORDER — ONDANSETRON HCL 4 MG/2ML IJ SOLN
4.0000 mg | Freq: Once | INTRAMUSCULAR | Status: AC | PRN
  Administered 2018-07-04: 4 mg via INTRAVENOUS

## 2018-07-04 MED ORDER — ONDANSETRON HCL 4 MG/2ML IJ SOLN
INTRAMUSCULAR | Status: DC | PRN
  Administered 2018-07-04: 4 mg via INTRAVENOUS

## 2018-07-04 MED ORDER — OXYCODONE-ACETAMINOPHEN 5-325 MG PO TABS
1.0000 | ORAL_TABLET | Freq: Once | ORAL | Status: AC
  Administered 2018-07-04: 1 via ORAL

## 2018-07-04 MED ORDER — BUPIVACAINE HCL (PF) 0.5 % IJ SOLN
INTRAMUSCULAR | Status: AC
  Filled 2018-07-04: qty 30

## 2018-07-04 MED ORDER — FAMOTIDINE 20 MG PO TABS
20.0000 mg | ORAL_TABLET | Freq: Once | ORAL | Status: AC
  Administered 2018-07-04: 20 mg via ORAL

## 2018-07-04 MED ORDER — KETOROLAC TROMETHAMINE 30 MG/ML IJ SOLN
30.0000 mg | Freq: Four times a day (QID) | INTRAMUSCULAR | Status: DC
  Administered 2018-07-04: 30 mg via INTRAVENOUS
  Filled 2018-07-04: qty 1

## 2018-07-04 MED ORDER — ONDANSETRON HCL 4 MG/2ML IJ SOLN
INTRAMUSCULAR | Status: AC
  Administered 2018-07-04: 4 mg via INTRAVENOUS
  Filled 2018-07-04: qty 2

## 2018-07-04 MED ORDER — OXYCODONE-ACETAMINOPHEN 5-325 MG PO TABS
ORAL_TABLET | ORAL | Status: AC
  Administered 2018-07-04: 1 via ORAL
  Filled 2018-07-04: qty 1

## 2018-07-04 MED ORDER — FENTANYL CITRATE (PF) 100 MCG/2ML IJ SOLN
INTRAMUSCULAR | Status: AC
  Filled 2018-07-04: qty 2

## 2018-07-04 MED ORDER — SUGAMMADEX SODIUM 200 MG/2ML IV SOLN
INTRAVENOUS | Status: DC | PRN
  Administered 2018-07-04: 200 mg via INTRAVENOUS

## 2018-07-04 MED ORDER — OXYCODONE-ACETAMINOPHEN 5-325 MG PO TABS: 1.0000 | ORAL_TABLET | ORAL | 0 refills | Status: DC | PRN

## 2018-07-04 MED ORDER — FENTANYL CITRATE (PF) 100 MCG/2ML IJ SOLN
INTRAMUSCULAR | Status: DC | PRN
  Administered 2018-07-04: 100 ug via INTRAVENOUS

## 2018-07-04 MED ORDER — LIDOCAINE HCL (CARDIAC) PF 100 MG/5ML IV SOSY
PREFILLED_SYRINGE | INTRAVENOUS | Status: DC | PRN
  Administered 2018-07-04: 100 mg via INTRAVENOUS

## 2018-07-04 MED ORDER — BUPIVACAINE HCL (PF) 0.5 % IJ SOLN
INTRAMUSCULAR | Status: DC | PRN
  Administered 2018-07-04: 8 mL

## 2018-07-04 MED ORDER — MIDAZOLAM HCL 2 MG/2ML IJ SOLN
INTRAMUSCULAR | Status: DC | PRN
  Administered 2018-07-04: 2 mg via INTRAVENOUS

## 2018-07-04 MED ORDER — DEXAMETHASONE SODIUM PHOSPHATE 10 MG/ML IJ SOLN
INTRAMUSCULAR | Status: DC | PRN
  Administered 2018-07-04: 10 mg via INTRAVENOUS

## 2018-07-04 MED ORDER — ACETAMINOPHEN 650 MG RE SUPP
650.0000 mg | RECTAL | Status: DC | PRN
  Filled 2018-07-04: qty 1

## 2018-07-04 MED ORDER — FENTANYL CITRATE (PF) 100 MCG/2ML IJ SOLN
INTRAMUSCULAR | Status: AC
  Administered 2018-07-04: 25 ug via INTRAVENOUS
  Filled 2018-07-04: qty 2

## 2018-07-04 MED ORDER — MORPHINE SULFATE (PF) 4 MG/ML IV SOLN: 1.0000 mg | INTRAVENOUS | Status: DC | PRN

## 2018-07-04 MED ORDER — FENTANYL CITRATE (PF) 100 MCG/2ML IJ SOLN
25.0000 ug | INTRAMUSCULAR | Status: DC | PRN
  Administered 2018-07-04 (×4): 25 ug via INTRAVENOUS

## 2018-07-04 MED ORDER — PROPOFOL 10 MG/ML IV BOLUS
INTRAVENOUS | Status: DC | PRN
  Administered 2018-07-04: 150 mg via INTRAVENOUS

## 2018-07-04 MED ORDER — ROCURONIUM BROMIDE 100 MG/10ML IV SOLN
INTRAVENOUS | Status: DC | PRN
  Administered 2018-07-04: 40 mg via INTRAVENOUS
  Administered 2018-07-04: 10 mg via INTRAVENOUS

## 2018-07-04 SURGICAL SUPPLY — 40 items
BLADE SURG SZ11 CARB STEEL (BLADE) ×2 IMPLANT
CANISTER SUCT 1200ML W/VALVE (MISCELLANEOUS) ×2 IMPLANT
CATH ROBINSON RED A/P 16FR (CATHETERS) ×2 IMPLANT
CHLORAPREP W/TINT 26 (MISCELLANEOUS) ×2 IMPLANT
COVER WAND RF STERILE (DRAPES) IMPLANT
DERMABOND ADVANCED (GAUZE/BANDAGES/DRESSINGS) ×1
DERMABOND ADVANCED .7 DNX12 (GAUZE/BANDAGES/DRESSINGS) ×1 IMPLANT
DRSG TELFA 4X3 1S NADH ST (GAUZE/BANDAGES/DRESSINGS) IMPLANT
GLOVE BIO SURGEON STRL SZ8 (GLOVE) ×4 IMPLANT
GLOVE INDICATOR 8.0 STRL GRN (GLOVE) ×2 IMPLANT
GOWN STRL REUS W/ TWL LRG LVL3 (GOWN DISPOSABLE) ×1 IMPLANT
GOWN STRL REUS W/ TWL XL LVL3 (GOWN DISPOSABLE) ×1 IMPLANT
GOWN STRL REUS W/TWL LRG LVL3 (GOWN DISPOSABLE) ×1
GOWN STRL REUS W/TWL XL LVL3 (GOWN DISPOSABLE) ×1
GRASPER SUT TROCAR 14GX15 (MISCELLANEOUS) ×2 IMPLANT
IRRIGATION STRYKERFLOW (MISCELLANEOUS) IMPLANT
IRRIGATOR STRYKERFLOW (MISCELLANEOUS) ×2
IV LACTATED RINGER IRRG 3000ML (IV SOLUTION)
IV LR IRRIG 3000ML ARTHROMATIC (IV SOLUTION) IMPLANT
KIT PINK PAD W/HEAD ARE REST (MISCELLANEOUS) ×2
KIT PINK PAD W/HEAD ARM REST (MISCELLANEOUS) ×1 IMPLANT
LABEL OR SOLS (LABEL) ×2 IMPLANT
NEEDLE VERESS 14GA 120MM (NEEDLE) ×2 IMPLANT
NS IRRIG 500ML POUR BTL (IV SOLUTION) ×2 IMPLANT
PACK GYN LAPAROSCOPIC (MISCELLANEOUS) ×2 IMPLANT
PAD PREP 24X41 OB/GYN DISP (PERSONAL CARE ITEMS) ×2 IMPLANT
POUCH SPECIMEN RETRIEVAL 10MM (ENDOMECHANICALS) IMPLANT
SCISSORS METZENBAUM CVD 33 (INSTRUMENTS) ×2 IMPLANT
SET TUBE SMOKE EVAC HIGH FLOW (TUBING) ×2 IMPLANT
SHEARS HARMONIC ACE PLUS 36CM (ENDOMECHANICALS) ×1 IMPLANT
SLEEVE ENDOPATH XCEL 5M (ENDOMECHANICALS) ×1 IMPLANT
SPONGE GAUZE 2X2 8PLY STRL LF (GAUZE/BANDAGES/DRESSINGS) IMPLANT
STRAP SAFETY 5IN WIDE (MISCELLANEOUS) ×2 IMPLANT
SUT VIC AB 0 CT1 36 (SUTURE) ×2 IMPLANT
SUT VIC AB 2-0 UR6 27 (SUTURE) IMPLANT
SUT VIC AB 4-0 PS2 18 (SUTURE) ×1 IMPLANT
SYR 10ML LL (SYRINGE) ×2 IMPLANT
SYSTEM WECK SHIELD CLOSURE (TROCAR) IMPLANT
TROCAR ENDO BLADELESS 11MM (ENDOMECHANICALS) IMPLANT
TROCAR XCEL NON-BLD 5MMX100MML (ENDOMECHANICALS) ×2 IMPLANT

## 2018-07-04 NOTE — Anesthesia Postprocedure Evaluation (Signed)
Anesthesia Post Note  Patient: Mary Diaz  Procedure(s) Performed: RIGHT LAPAROSCOPIC OVARIAN CYSTECTOMY with peritoneal biopsies (Right )  Patient location during evaluation: PACU Anesthesia Type: General Level of consciousness: awake and alert Pain management: pain level controlled Vital Signs Assessment: post-procedure vital signs reviewed and stable Respiratory status: spontaneous breathing and respiratory function stable Cardiovascular status: stable Anesthetic complications: no     Last Vitals:  Vitals:   07/04/18 1049 07/04/18 1058  BP: (!) 141/83   Pulse: (!) 103 (!) 109  Resp: (!) 24 18  Temp:    SpO2: 100% 100%    Last Pain:  Vitals:   07/04/18 1058  PainSc: 8                  KEPHART,WILLIAM K

## 2018-07-04 NOTE — Op Note (Signed)
  Operative Note   07/04/2018  PRE-OP DIAGNOSIS: Right Ovarian Cyst, Pelvic Pain   POST-OP DIAGNOSIS: same, with endometriosis   PROCEDURE: Procedure(s): RIGHT LAPAROSCOPIC OVARIAN CYSTECTOMY with peritoneal biopsies   SURGEON: Barnett Applebaum, MD, FACOG  ASST: Dr Glennon Mac, No other capable assistant available, in surgery requiring high level assistant.  ANESTHESIA: Choice   ESTIMATED BLOOD LOSS: Min  COMPLICATIONS: None  DISPOSITION: PACU - hemodynamically stable.  CONDITION: stable  FINDINGS: Laparoscopic survey of the abdomen revealed a grossly normal uterus, tubes, ovaries, liver edge, gallbladder edge and appendix, No intra-abdominal adhesions were noted. Right Endometrioma-like Ovarian Cyst noted.  Areas of endometriosis implants noted throughout pelvis.  PROCEDURE IN DETAIL: The patient was taken to the OR where anesthesia was administed. The patient was positioned in dorsal lithotomy in the [place]. The patient was then examined under anesthesia with the above noted findings. The patient was prepped and draped in the normal sterile fashion and foley catheter was placed. A Graves speculum was placed in the vagina and the anterior lip of the cervix was grasped with a single toothed tenaculum. IUD strings visualized and not disturbed. Uterine mobility was found to be satisfactory. The speculum was then removed.  Attention was turned to the patient's abdomen where a 5 mm skin incision was made in the umbilical fold, after injection of local anesthesia. The Veress step needle was carefully introduced into the peritoneal cavity with placement confirmed using the hanging drop technique.  Pneumoperitoneum was obtained. The 5 mm port was then placed under direct visualization with the operative laparoscope.  Trendelenburg positioning.  Additional 32mm trocars was then placed in the RLQ and LLQ lateral to the inferior epigastric blood vessels under direct visualization with the  laparoscope.  Instrumentation to visualize complete pelvic anatomy performed. The ovarian cyst is identified and stabilized.  An incision is made with maroon colored fluid noted.  Fluid is aspirated.  Cyst wall is dissected free from the ovarian cortex and removed.  Hemostasis is visualized and assured.  Contralateral ovary seen as normal.  Pelvic cavity is cleaned with any fluid aspirated.  Additional peritoneal biopsies were obtained of areas consistant with endometriosis at the anterior uterine serosa, left uterosacral, and left pelvic sidewall.  There area where right ovarian cyst was resting in the fossa and near the uterus was inflammed and raw.  No injuries to ureter or other structures visualized.  Instruments and trocars removed, gas expelled, and skin closed with skin adhesive glue.  Instrument, needle, and sponge counts correct x2 at the conclusion of the case.  Pt goes to recovery room in stable condition.  Barnett Applebaum, MD, [facility], [facility] 07/04/2018  10:50 AM

## 2018-07-04 NOTE — Anesthesia Procedure Notes (Signed)
Procedure Name: Intubation Date/Time: 07/04/2018 9:28 AM Performed by: Allean Found, CRNA Pre-anesthesia Checklist: Patient identified, Emergency Drugs available, Suction available, Patient being monitored and Timeout performed Patient Re-evaluated:Patient Re-evaluated prior to induction Oxygen Delivery Method: Circle system utilized Preoxygenation: Pre-oxygenation with 100% oxygen Induction Type: IV induction Ventilation: Mask ventilation without difficulty Laryngoscope Size: Miller and 2 Grade View: Grade III Tube type: Oral Tube size: 7.0 mm Number of attempts: 1 Airway Equipment and Method: Stylet,  Bougie stylet and Oral airway Placement Confirmation: ETT inserted through vocal cords under direct vision,  positive ETCO2 and breath sounds checked- equal and bilateral Secured at: 21 cm Tube secured with: Tape Dental Injury: Teeth and Oropharynx as per pre-operative assessment  Difficulty Due To: Difficulty was unanticipated and Difficult Airway- due to anterior larynx Future Recommendations: Recommend- induction with short-acting agent, and alternative techniques readily available

## 2018-07-04 NOTE — Discharge Instructions (Signed)
Ovarian Cystectomy  Ovarian cystectomy is a procedure that is done to remove a fluid-filled sac (cyst) on an ovary. The ovaries are small organs that produce eggs in women. Various types of cysts can form on the ovaries. Most are not cancerous. This procedure may be done for cysts that are large, cause symptoms, or do not go away on their own. It may also be done for a cyst that is cancerous or might be cancerous. This surgery can be done using a laparoscopic technique or an open abdominal technique. The laparoscopic technique involves smaller incisions and results in a faster recovery time. The technique used will depend on your age, the type of cyst that you have, and whether the cyst is cancerous. The laparoscopic technique is not used for a cancerous cyst. Tell a health care provider about:  Any allergies you have.  All medicines you are taking, including vitamins, herbs, eye drops, creams, and over-the-counter medicines.  Any problems you or family members have had with the use of anesthetic medicines.  Any blood disorders you have.  Any surgeries you have had.  Any medical conditions you have.  Whether you are pregnant or may be pregnant. What are the risks? Generally, this is a safe procedure. However, problems may occur, including:  Excessive bleeding.  Infection.  Damage to other structures or organs.  Blood clots.  Inability get pregnant (infertility). What happens before the procedure? Staying hydrated Follow instructions from your health care provider about hydration, which may include:  Up to 2 hours before the procedure - you may continue to drink clear liquids, such as water, clear fruit juice, black coffee, and plain tea. Eating and drinking restrictions Follow instructions from your health care provider about eating and drinking, which may include:  8 hours before the procedure - stop eating and drinking everything except clear liquids.  6 hours before the  procedure - stop eating light meals or foods, such as toast or cereal.  6 hours before the procedure - stop drinking milk or drinks that contain milk.  2 hours before the procedure - stop drinking clear liquids. Medicines  Ask your health care provider about: ? Changing or stopping your regular medicines. This is especially important if you are taking diabetes medicines or blood thinners. ? Taking medicines such as aspirin and ibuprofen. These medicines can thin your blood. Do not take these medicines before your procedure if your health care provider instructs you not to.  You may be given antibiotic medicine to help prevent infection. General instructions  Do not use any products that contain nicotine or tobacco, such as cigarettes and e-cigarettes, for 2 weeks before the procedure. If you need help quitting, ask your health care provider.  Ask your health care provider how your surgical site will be marked or identified.  Plan to have someone take you home from the hospital or clinic.  Plan to have someone help with household activities for 1-2 weeks after the procedure.  You may be asked to shower with a germ-killing soap.  Let your health care provider know if you develop a cold or any infection before your surgery. What happens during the procedure?  To reduce your risk of infection: ? Your health care team will wash or sanitize their hands. ? Your skin will be washed with soap. ? Hair may be removed from the surgical area.  An IV will be inserted into one of your veins.  You will be given one or more of the  following: ? A medicine to help you relax (sedative). ? A medicine to make you fall asleep (general anesthetic).  Small monitors will be attached to your body. They will be used to check your heart, blood pressure, and oxygen level.  A breathing tube will be placed into your lungs during the procedure.  Your surgeon will use one of the following methods to perform  the surgery: Laparoscopic technique  Several small incisions will be made in your abdomen. These will typically be about 1 to 2 cm long.  Your abdomen will be filled with carbon dioxide gas to make it expand. This will give the surgeon more room to operate. It will also make your organs easier to see.  A thin, lighted tube with a camera (laparoscope) will be put through one of the small incisions. The laparoscope will send a picture to a TV screen in the operating room. This will give the surgeon a good view of your organs.  Hollow tubes will be put through the other small incisions in your abdomen. The tools needed for the procedure will be put through these tubes.  The ovary with the cyst will be identified, and the cyst will be removed. It will then be sent to the lab for testing. If the cyst has cancer cells, both ovaries may need to be removed during a different surgery.  Tools will be removed. The incisions will then be closed with stitches or skin glue. Bandages (dressings) may be applied. Open abdominal technique  A single, large incision will be made along your bikini line or in the middle of your lower abdomen.  The ovary with the cyst will be identified, and the cyst will be removed. It will then be sent to the lab for testing. If the cyst has cancer cells, both ovaries may need to be removed during a different surgery.  The incision will then be closed with stitches or staples.  Bandages (dressings) may be applied. The procedure may vary among health care providers and hospitals. What happens after the procedure?  Your blood pressure, heart rate, breathing rate, and blood oxygen level will be monitored until the medicines you were given have worn off.  Your IV access will be removed after you are able to eat and drink well.  You may be given medicine for pain or to help you sleep.  You may be given an antibiotic medicine.  Do not drive for 24 hours if you were given a  sedative. Summary  Ovarian cystectomy is a procedure that is done to remove a cyst on an ovary.  This procedure may be done for cysts that are large, cause symptoms, or do not go away on their own. It may also be done for a cyst that is cancerous or might be cancerous.  Follow instructions from your health care provider about eating and drinking before the procedure.  After the cyst is removed, it will be sent to the lab for testing. This information is not intended to replace advice given to you by your health care provider. Make sure you discuss any questions you have with your health care provider. Document Released: 10/18/2006 Document Revised: 12/03/2016 Document Reviewed: 02/10/2016 Elsevier Patient Education  2020 [employer]   AMBULATORY SURGERY  DISCHARGE INSTRUCTIONS   1) The drugs that you were given will stay in your system until tomorrow so for the next 24 hours you should not:  A) Drive an automobile B) Make any legal decisions C) Drink any  alcoholic beverage   2) You may resume regular meals tomorrow.  Today it is better to start with liquids and gradually work up to solid foods.  You may eat anything you prefer, but it is better to start with liquids, then soup and crackers, and gradually work up to solid foods.   3) Please notify your doctor immediately if you have any unusual bleeding, trouble breathing, redness and pain at the surgery site, drainage, fever, or pain not relieved by medication.    4) Additional Instructions:        Please contact your physician with any problems or Same Day Surgery at [phone], Monday through Friday 6 am to 4 pm, or [place] [facility] number at [phone].

## 2018-07-04 NOTE — Anesthesia Preprocedure Evaluation (Signed)
Anesthesia Evaluation  Patient identified by MRN, date of birth, ID band Patient awake    Reviewed: Allergy & Precautions, NPO status , Patient's Chart, lab work & pertinent test results  History of Anesthesia Complications Negative for: history of anesthetic complications  Airway Mallampati: II       Dental   Pulmonary neg sleep apnea, neg COPD,           Cardiovascular (-) hypertension(-) Past MI and (-) CHF (-) dysrhythmias (-) Valvular Problems/Murmurs     Neuro/Psych neg Seizures    GI/Hepatic Neg liver ROS, neg GERD  ,  Endo/Other  neg diabetes  Renal/GU negative Renal ROS     Musculoskeletal   Abdominal   Peds  Hematology   Anesthesia Other Findings   Reproductive/Obstetrics                             Anesthesia Physical Anesthesia Plan  ASA: I  Anesthesia Plan: General   Post-op Pain Management:    Induction: Intravenous  PONV Risk Score and Plan: 3 and Dexamethasone, Ondansetron and Midazolam  Airway Management Planned: Oral ETT  Additional Equipment:   Intra-op Plan:   Post-operative Plan:   Informed Consent: I have reviewed the patients History and Physical, chart, labs and discussed the procedure including the risks, benefits and alternatives for the proposed anesthesia with the patient or authorized representative who has indicated his/her understanding and acceptance.       Plan Discussed with:   Anesthesia Plan Comments:         Anesthesia Quick Evaluation

## 2018-07-04 NOTE — Interval H&P Note (Signed)
History and Physical Interval Note:  07/04/2018 8:46 AM  Mary Diaz  has presented today for surgery, with the diagnosis of RLQ PAIN COMPLEX OVARIAN CYST ENDOMETRIOSIS.  The various methods of treatment have been discussed with the patient and family. After consideration of risks, benefits and other options for treatment, the patient has consented to  Procedure(s): LAPAROSCOPIC OVARIAN CYSTECTOMY (N/A) as a surgical intervention.  The patient's history has been reviewed, patient examined, no change in status, stable for surgery.  I have reviewed the patient's chart and labs.  Questions were answered to the patient's satisfaction.     Hoyt Koch

## 2018-07-04 NOTE — Transfer of Care (Signed)
Immediate Anesthesia Transfer of Care Note  Patient: Mary Diaz  Procedure(s) Performed: RIGHT LAPAROSCOPIC OVARIAN CYSTECTOMY with peritoneal biopsies (Right )  Patient Location: PACU  Anesthesia Type:General  Level of Consciousness: awake, alert  and oriented  Airway & Oxygen Therapy: Patient connected to face mask oxygen  Post-op Assessment: Report given to RN and Post -op Vital signs reviewed and stable  Post vital signs: Reviewed and stable  Last Vitals:  Vitals Value Taken Time  BP 141/83 07/04/18 1049  Temp 36.4 C 07/04/18 1049  Pulse 105 07/04/18 1055  Resp 17 07/04/18 1055  SpO2 100 % 07/04/18 1055  Vitals shown include unvalidated device data.  Last Pain:  Vitals:   07/04/18 0813  PainSc: 0-No pain         Complications: No apparent anesthesia complications

## 2018-07-04 NOTE — Anesthesia Post-op Follow-up Note (Signed)
Anesthesia QCDR form completed.        

## 2018-07-04 NOTE — Progress Notes (Signed)
Ch visited pt in pre-op. Pt shared that she has already seen the anesthesiologist and was excited to finally have the procedure to have a cyst removed that she became aware that she had in February. Pt presented to have a positive affect in pre-op and is hopeful to return home to her dogs post-op if there are no issues during the surgery.    07/04/18 0900  Clinical Encounter Type  Visited With Patient  Visit Type Pre-op;Social support  Spiritual Encounters  Spiritual Needs Emotional;Grief support  Stress Factors  Patient Stress Factors Health changes  Family Stress Factors None identified

## 2018-07-06 LAB — SURGICAL PATHOLOGY

## 2018-07-26 ENCOUNTER — Encounter: Payer: Self-pay | Admitting: Obstetrics & Gynecology

## 2018-07-26 ENCOUNTER — Ambulatory Visit (INDEPENDENT_AMBULATORY_CARE_PROVIDER_SITE_OTHER): Payer: 59 | Admitting: Obstetrics & Gynecology

## 2018-07-26 ENCOUNTER — Other Ambulatory Visit: Payer: Self-pay

## 2018-07-26 VITALS — BP 130/80 | Ht 64.0 in | Wt 179.0 lb

## 2018-07-26 DIAGNOSIS — N809 Endometriosis, unspecified: Secondary | ICD-10-CM

## 2018-07-26 DIAGNOSIS — N83201 Unspecified ovarian cyst, right side: Secondary | ICD-10-CM

## 2018-07-26 NOTE — Progress Notes (Signed)
  Postoperative Follow-up Patient presents post op from right ovarian cystectomy for Endometriosis, 3 weeks ago. Path: DIAGNOSIS:  A. OVARIAN CYST, RIGHT; CYSTECTOMY:  - ENDOMETRIOSIS AND BENIGN OVARIAN STROMA WITH HEMORRHAGE AND FEW BENIGN  FOLLICLE CYSTS.  - SURFACE/SEROSAL FIBROUS ADHESIONS.  - NEGATIVE FOR ATYPIA AND MALIGNANCY.   B. PERITONEUM, ANTERIOR LEFT UTERUS; BIOPSY:  - ENDOMETRIOSIS/ENDOMETRIOMA AND SEROSAL FIBROUS ADHESIONS.  - NEGATIVE FOR ATYPIA AND MALIGNANCY.   C. PERITONEUM, LEFT PELVIC SIDEWALL; BIOPSY:  - FIBROADIPOSE TISSUE WITH FOCI OF HEMOSIDERIN CONSISTENT WITH OLD  HEMORRHAGE COMPATIBLE WITH ENDOMETRIOTIC SITE.  - NEGATIVE FOR ATYPIA AND MALIGNANCY.   D. PERITONEUM, LEFT UTEROSACRAL; BIOPSY:  - FINDINGS CONSISTENT WITH ENDOMETRIOSIS/ENDOMETRIOMA.  - NEGATIVE FOR ATYPIA AND MALIGNANCY  Subjective: Patient reports marked improvement in her preop symptoms. Eating a regular diet without difficulty. The patient is not having any pain.  Activity: normal activities of daily living. Patient reports additional symptom's since surgery of None.  Objective: BP 130/80   Ht 5\' 4"  (1.626 m)   Wt 179 lb (81.2 kg)   BMI 30.73 kg/m  Physical Exam Constitutional:      General: She is not in acute distress.    Appearance: She is well-developed.  Cardiovascular:     Rate and Rhythm: Normal rate.  Pulmonary:     Effort: Pulmonary effort is normal.  Abdominal:     General: There is no distension.     Palpations: Abdomen is soft.     Tenderness: There is no abdominal tenderness.     Comments: Incision Healing Well   Musculoskeletal: Normal range of motion.  Neurological:     Mental Status: She is alert and oriented to person, place, and time.     Cranial Nerves: No cranial nerve deficit.  Skin:    General: Skin is warm and dry.     Assessment: s/p :  right ovarian cystectomy progressing well  Plan: Patient has done well after surgery with no apparent  complications.  I have discussed the post-operative course to date, and the expected progress moving forward.  The patient understands what complications to be concerned about.  I will see the patient in routine follow up, or sooner if needed.    Activity plan: No restriction.  Risks of endometriosis recurrence and infertility discussed. Meds such as Lupron and Freida Busman discussed as future options based on sx's of pain or infertility. Plans to try for pregnancy next year.  Has IUD currently.  Hoyt Koch 07/26/2018, 9:48 AM

## 2019-02-20 NOTE — Telephone Encounter (Signed)
Mary Diaz rcvd/charged 09/07/17

## 2019-04-23 ENCOUNTER — Ambulatory Visit: Payer: BC Managed Care – PPO | Attending: Internal Medicine

## 2019-04-23 DIAGNOSIS — Z20822 Contact with and (suspected) exposure to covid-19: Secondary | ICD-10-CM

## 2019-04-24 LAB — NOVEL CORONAVIRUS, NAA: SARS-CoV-2, NAA: NOT DETECTED

## 2019-04-24 LAB — SARS-COV-2, NAA 2 DAY TAT

## 2019-08-21 ENCOUNTER — Encounter: Payer: Self-pay | Admitting: Obstetrics and Gynecology

## 2019-08-21 ENCOUNTER — Ambulatory Visit (INDEPENDENT_AMBULATORY_CARE_PROVIDER_SITE_OTHER): Payer: BC Managed Care – PPO | Admitting: Obstetrics and Gynecology

## 2019-08-21 ENCOUNTER — Other Ambulatory Visit: Payer: Self-pay

## 2019-08-21 VITALS — BP 130/90 | Ht 64.0 in | Wt 201.0 lb

## 2019-08-21 DIAGNOSIS — Z30432 Encounter for removal of intrauterine contraceptive device: Secondary | ICD-10-CM | POA: Diagnosis not present

## 2019-08-21 NOTE — Progress Notes (Signed)
   Chief Complaint  Patient presents with  . IUD removal    to start conceiving     History of Present Illness:  Mary Diaz is a 29 y.o. that had a [place] IUD placed approximately 2 years ago. Since that time, she has done really well. Would like to conceive, taking PNVs.  Hx of ovar cystectomy 7/20 with Dr. Tiburcio Pea, doing well.  BP 130/90   Ht 5\' 4"  (1.626 m)   Wt 201 lb (91.2 kg)   LMP 07/21/2019 (Exact Date)   BMI 34.50 kg/m   Pelvic exam:  Two IUD strings present seen coming from the cervical os. EGBUS, vaginal vault and cervix: within normal limits  IUD Removal Strings of IUD identified and grasped.  IUD removed without problem with ring forceps.  Pt tolerated this well.  IUD noted to be intact.  Assessment:  Encounter for IUD removal    Plan: IUD removed and plan for contraception is no method.  Lamae Fosco B. Tahiri Shareef, PA-C 08/21/2019 3:02 PM

## 2019-08-21 NOTE — Patient Instructions (Signed)
I value your feedback and entrusting us with your care. If you get a [facility] patient survey, I would appreciate you taking the time to let us know about your experience today. Thank you!  As of December 14, 2018, your lab results will be released to your MyChart immediately, before I even have a chance to see them. Please give me time to review them and contact you if there are any abnormalities. Thank you for your patience.  

## 2020-01-05 NOTE — L&D Delivery Note (Signed)
Delivery Note At 10:33 PM a viable female was delivered via Vaginal, Spontaneous (Presentation: Left Occiput Anterior right nuchal arm reduced on perineum).  APGAR: 9, 9; weight  pending.   Placenta status: Spontaneous, Intact.  Cord: 3 vessels with the following complications: None.  Cord pH: N/A  Anesthesia: Epidural Episiotomy: None Lacerations:  none Suture Repair:  none Est. Blood Loss (mL):   Mom to postpartum.  Baby to Couplet care / Skin to Skin.  Vena Austria 12/14/2020, 10:50 PM

## 2020-02-21 IMAGING — US US PELVIS COMPLETE
1 series · 13 of 25 positions shown · non-contrast
Comparison: 04/13/2018 and 02/22/2018 pelvic ultrasound.

CLINICAL DATA: 27 y/o F; right adnexal and right lower quadrant
pain. History of right ovarian cyst.

EXAM:
TRANSABDOMINAL AND TRANSVAGINAL ULTRASOUND OF PELVIS
DOPPLER ULTRASOUND OF OVARIES
TECHNIQUE: Both transabdominal and transvaginal ultrasound examinations of the
pelvis were performed. Transabdominal technique was performed for
global imaging of the pelvis including uterus, ovaries, adnexal
regions, and pelvic cul-de-sac.
It was necessary to proceed with endovaginal exam following the
transabdominal exam to visualize the adnexa. Color and duplex
Doppler ultrasound was utilized to evaluate blood flow to the
ovaries.

[Series 1: us pelvis complete · 13 of 88 slices shown]
[im 1/88]
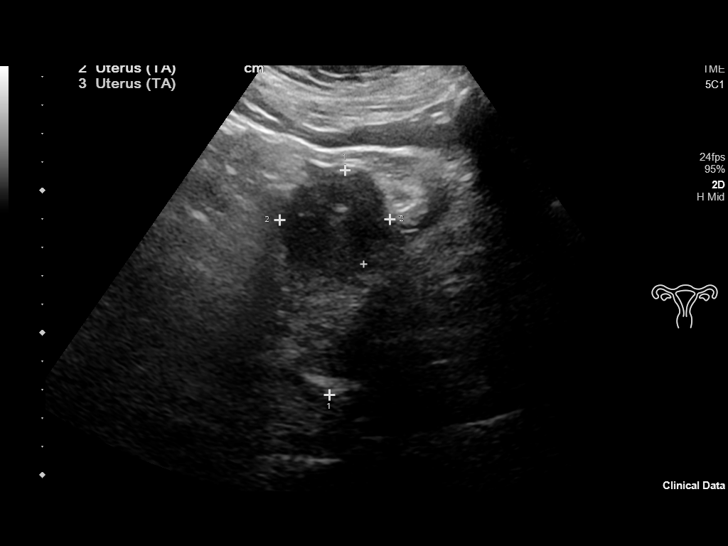
[im 8/88]
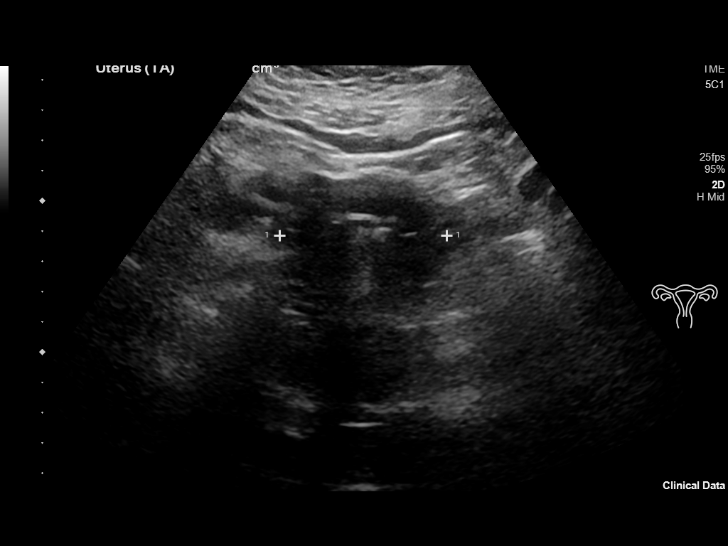
[im 15/88]
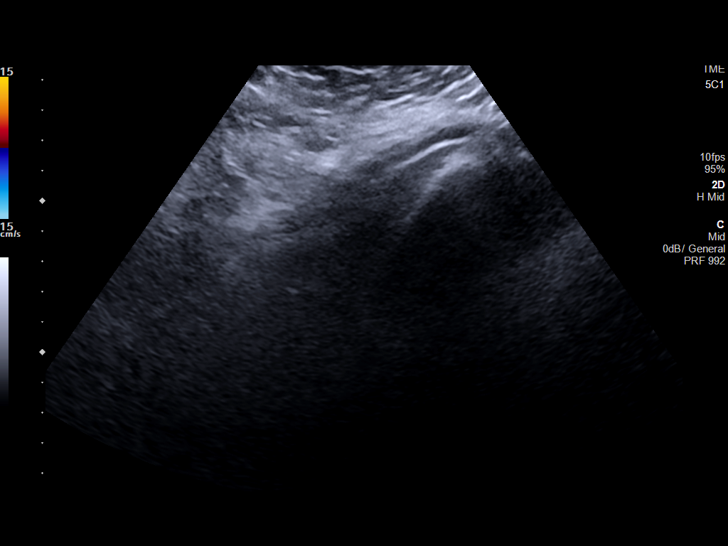
[im 22/88]
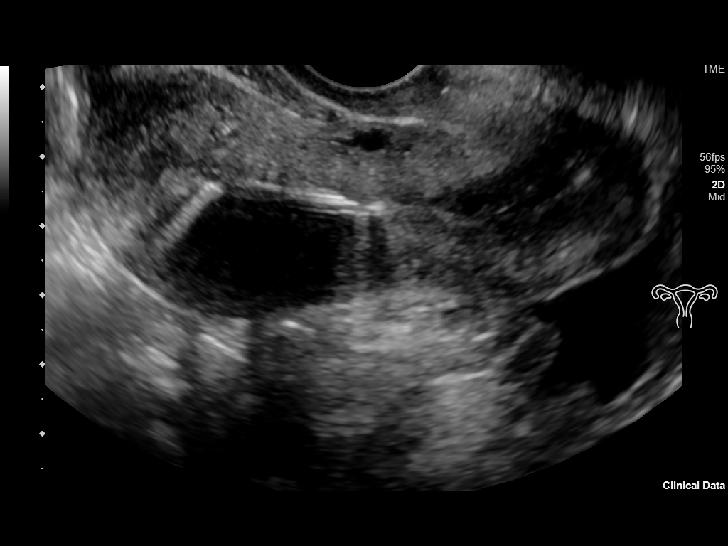
[im 30/88]
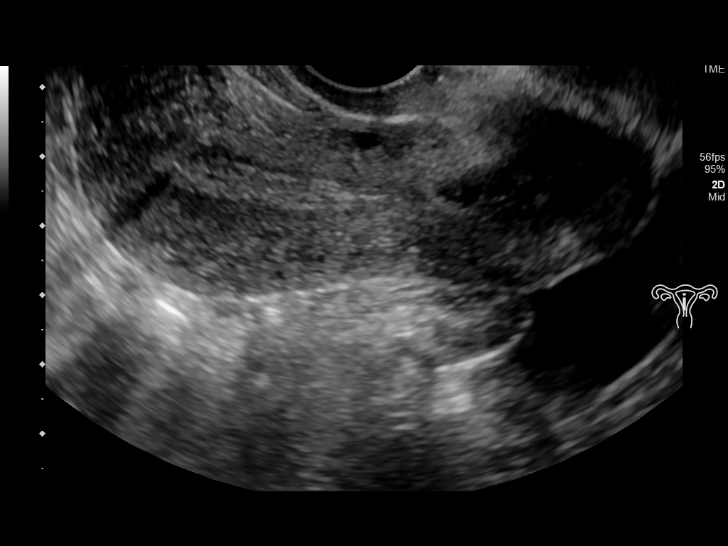
[im 37/88]
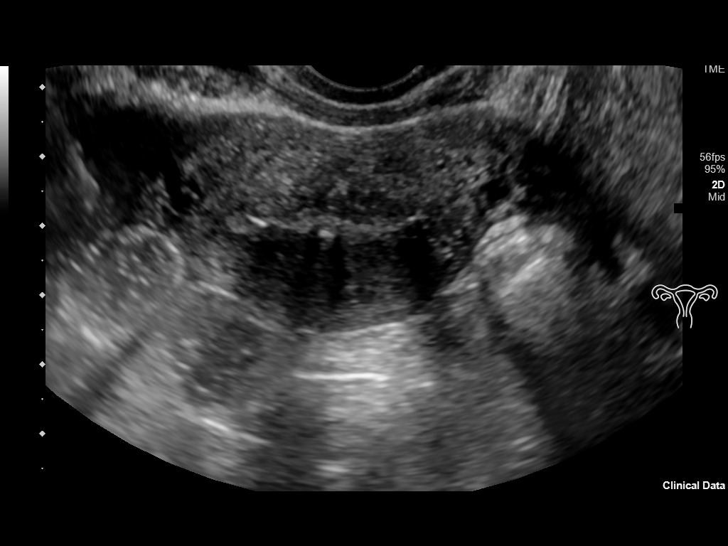
[im 44/88]
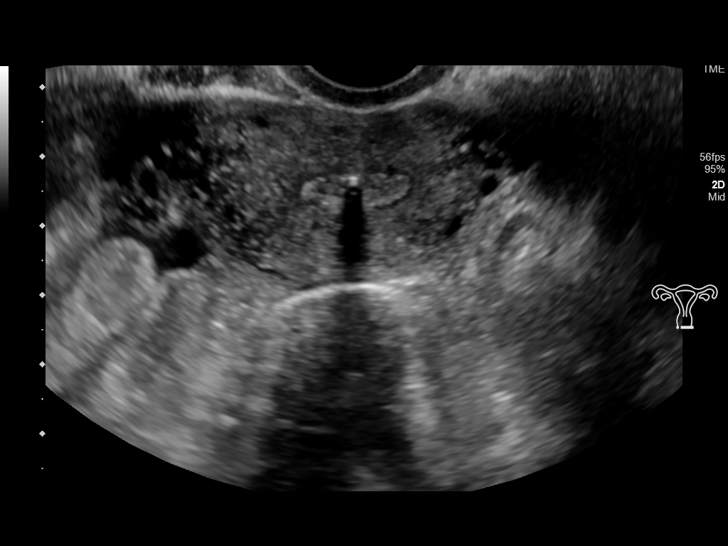
[im 51/88]
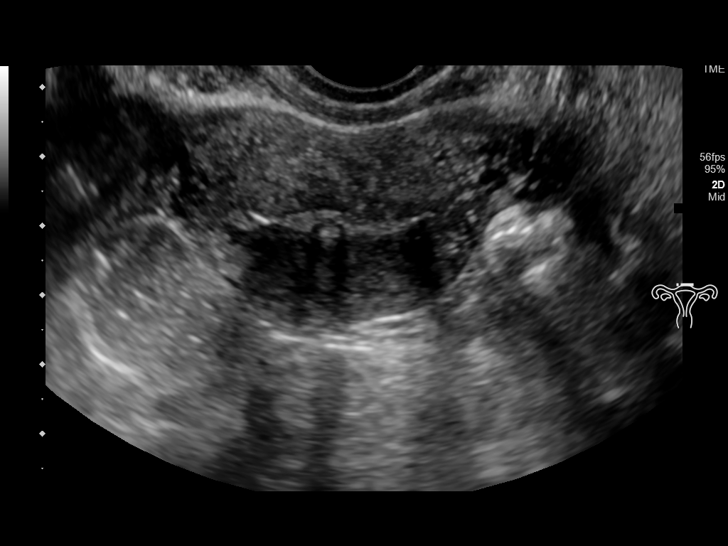
[im 59/88]
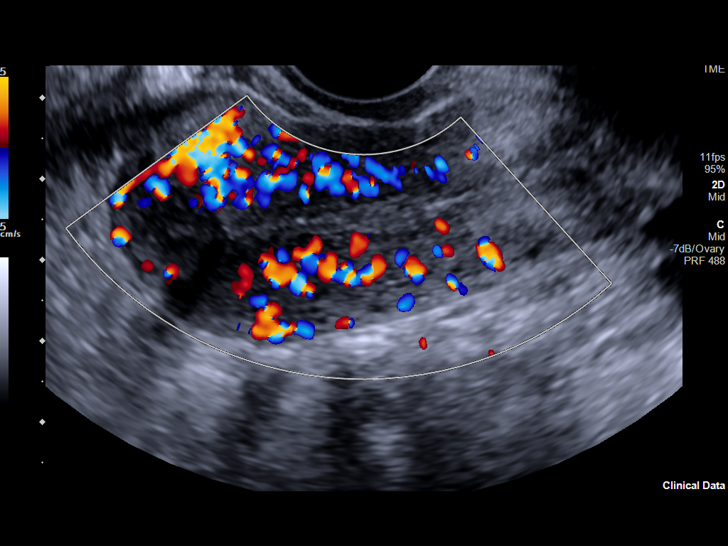
[im 66/88]
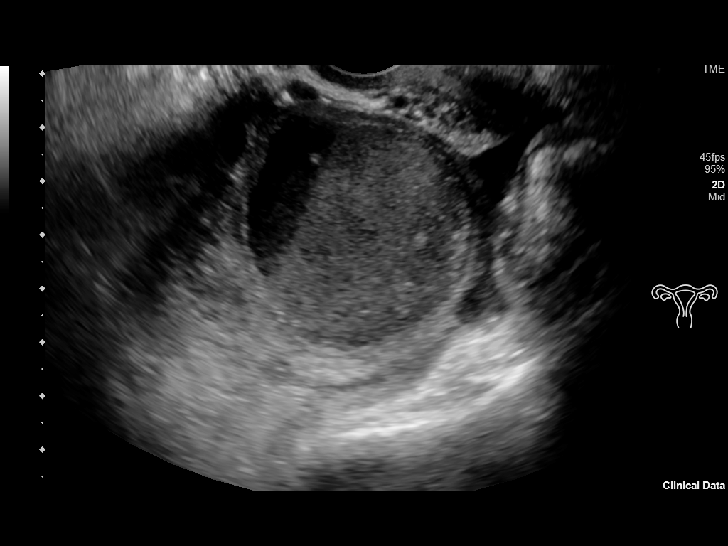
[im 73/88]
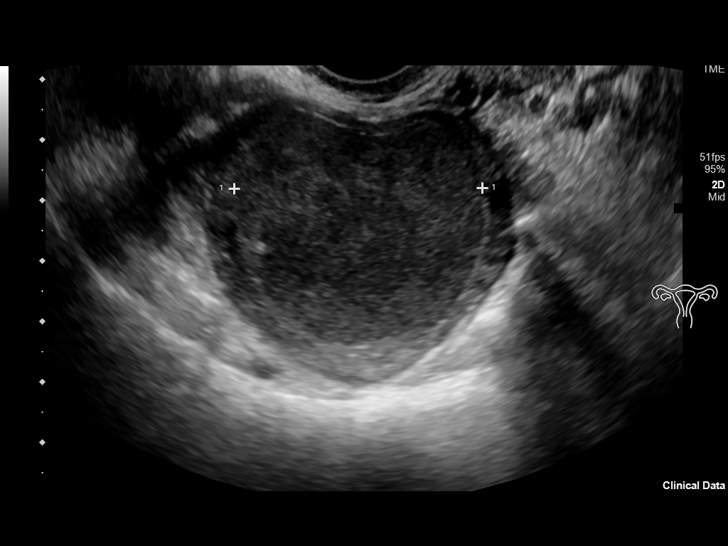
[im 80/88]
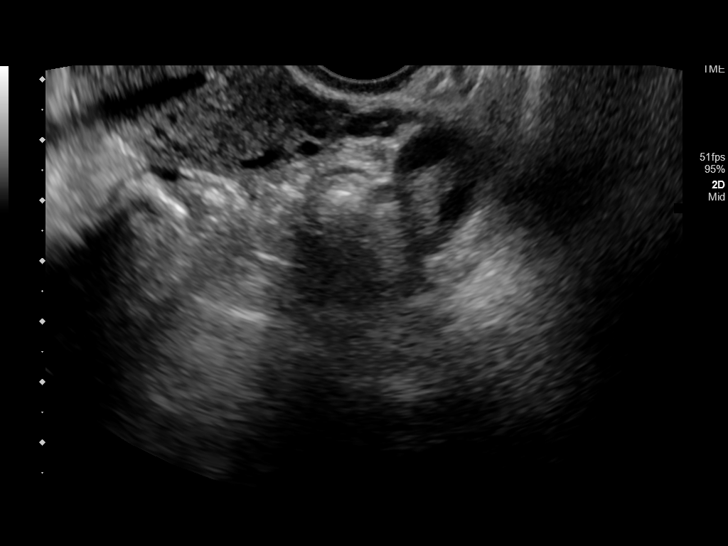
[im 88/88]
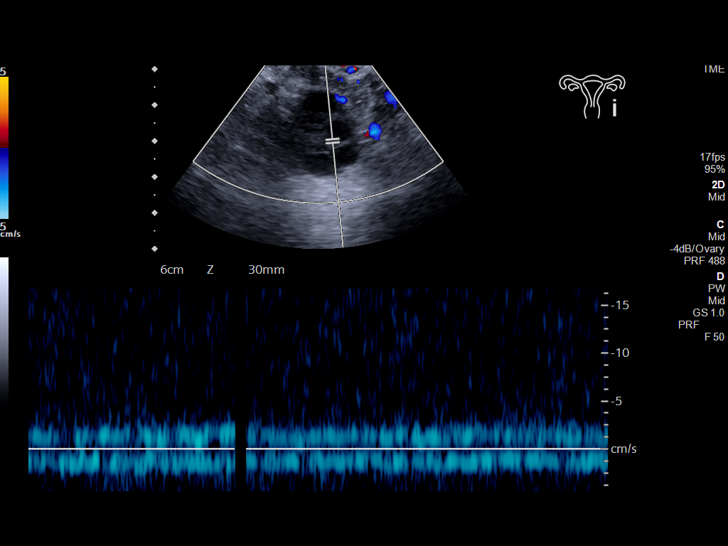

[13 of 25 positions shown; findings below may reference images not displayed]

FINDINGS: Uterus

Measurements: 8.3 x 3.5 x 4.5 cm = volume: 69.1 mL. No fibroids or
other mass visualized.

Endometrium

Thickness: 4 mm. No focal abnormality visualized. IUD well seated
within the uterine fundus.

Right ovary

Measurements: 5.4 x 5.6 x 5.4 cm = volume: 86.3 mL. Right ovarian
cystic mass measuring 4.3 x 3.7 x 4.1 cm (volume = 34 cm^3),
previously 50.9 cc. Cyst contents are heterogeneously echogenic and
demonstrate a debris fluid level. No blood flow within the mass on
color Doppler.

Left ovary

Measurements: 2.8 x 2.0 x 1.9 cm = volume: 5.4 mL. Normal
appearance/no adnexal mass.

Pulsed Doppler evaluation of both ovaries demonstrates normal
low-resistance arterial and venous waveforms.

Other findings

No abnormal free fluid.
IMPRESSION: 1. No acute process identified.
2. Right ovary complex cyst is decreased in size currently measuring
34 cc, previously 50.9 cc. Persistence over several months favors
endometrioma or hemorrhagic cyst, however, the lesion does appear to
be decreasing in size. Continued ultrasound follow-up recommended.

## 2020-04-28 ENCOUNTER — Other Ambulatory Visit (HOSPITAL_COMMUNITY)
Admission: RE | Admit: 2020-04-28 | Discharge: 2020-04-28 | Disposition: A | Discharge status: Home / Self Care | Payer: BC Managed Care – PPO | Source: Ambulatory Visit | Attending: Obstetrics | Admitting: Obstetrics

## 2020-04-28 ENCOUNTER — Encounter: Payer: Self-pay | Admitting: Obstetrics

## 2020-04-28 ENCOUNTER — Ambulatory Visit (INDEPENDENT_AMBULATORY_CARE_PROVIDER_SITE_OTHER): Payer: BC Managed Care – PPO | Admitting: Obstetrics

## 2020-04-28 ENCOUNTER — Other Ambulatory Visit: Payer: Self-pay

## 2020-04-28 VITALS — BP 118/80 | Wt 201.0 lb

## 2020-04-28 DIAGNOSIS — Z3A01 Less than 8 weeks gestation of pregnancy: Secondary | ICD-10-CM | POA: Diagnosis not present

## 2020-04-28 DIAGNOSIS — Z3481 Encounter for supervision of other normal pregnancy, first trimester: Secondary | ICD-10-CM | POA: Insufficient documentation

## 2020-04-28 DIAGNOSIS — N912 Amenorrhea, unspecified: Secondary | ICD-10-CM

## 2020-04-28 DIAGNOSIS — Z3480 Encounter for supervision of other normal pregnancy, unspecified trimester: Secondary | ICD-10-CM

## 2020-04-28 LAB — POCT URINE PREGNANCY: Preg Test, Ur: POSITIVE — AB

## 2020-04-28 NOTE — Progress Notes (Signed)
NOB- no concerns ?

## 2020-04-28 NOTE — Progress Notes (Signed)
New Obstetric Patient H&P    Chief Complaint: "Desires prenatal care"   History of Present Illness: Patient is a 30 y.o. G2P0010 Not Hispanic or Latino female, LMP 03/14/2020 presents with amenorrhea and positive home pregnancy test. Based on her  LMP, her EDD is Estimated Date of Delivery: 12/19/20 and her EGA is [redacted]w[redacted]d. Cycles are 5. days, regular, and occur approximately every : 28 days. Her last pap smear was about 3 years ago and was no abnormalities.    She had a urine pregnancy test which was positive about 2 week(s)  ago. Her last menstrual period was normal and lasted for  approximately 5 day(s). Since her LMP she claims she has experienced fatigue, nausea and limited. She denies vaginal bleeding. Her past medical history is noncontributory. Her prior pregnancies are notable for none  Since her LMP, she admits to the use of tobacco products  no She claims she has gained   no pounds since the start of her pregnancy.  There are cats in the home in the home  yes If yes Indoor She admits close contact with children on a regular basis  no  She has had chicken pox in the past yes She has had Tuberculosis exposures, symptoms, or previously tested positive for TB   no Current or past history of domestic violence. no  Genetic Screening/Teratology Counseling: (Includes patient, baby's father, or anyone in either family with:)   1. Patient's age >/= 72 [facility]  no 2. Thalassemia ([place], [place], Mediterranean, or Asian background): MCV<80  no 3. Neural tube defect (meningomyelocele, spina bifida, anencephaly)  no 4. Congenital heart defect  no  5. Down syndrome  no 6. Tay-Sachs (Jewish, [place]  no 7. Canavan's Disease  no 8. Sickle cell disease or trait (African)  no  9. Hemophilia or other blood disorders  no  10. Muscular dystrophy  no  11. Cystic fibrosis  no  12. Huntington's Chorea  no  13. Mental retardation/autism  no 14. Other inherited genetic or chromosomal  disorder  no 15. Maternal metabolic disorder (DM, PKU, etc)  no 16. Patient or FOB with a child with a birth defect not listed above no  16a. Patient or FOB with a birth defect themselves no 17. Recurrent pregnancy loss, or stillbirth  no  18. Any medications since LMP other than prenatal vitamins (include vitamins, supplements, OTC meds, drugs, alcohol)  no 19. Any other genetic/environmental exposure to discuss  no  Infection History:   1. Lives with someone with TB or TB exposed  no  2. Patient or partner has history of genital herpes  no 3. Rash or viral illness since LMP  no 4. History of STI (GC, CT, HPV, syphilis, HIV)  no 5. History of recent travel :  no  Other pertinent information:  Yes. She was adopted, so does not have a family history known to her.     Review of Systems:10 point review of systems negative unless otherwise noted in HPI  Past Medical History:  Past Medical History:  Diagnosis Date  . Anemia   . Endometriosis   . Ovarian cyst     Past Surgical History:  Past Surgical History:  Procedure Laterality Date  . LAPAROSCOPIC OVARIAN CYSTECTOMY Right 07/04/2018   Procedure: RIGHT LAPAROSCOPIC OVARIAN CYSTECTOMY with peritoneal biopsies;  Surgeon: Nadara Mustard, MD;  Location: [facility] ORS;  Service: Gynecology;  Laterality: Right;    Gynecologic History: Patient's last menstrual period  was 03/14/2020 (exact date).  Obstetric History: G2P0010  Family History:  Family History  Adopted: Yes    Social History:  Social History   Socioeconomic History  . Marital status: Married    Spouse name: Not on file  . Number of children: Not on file  . Years of education: Not on file  . Highest education level: Not on file  Occupational History  . Not on file  Tobacco Use  . Smoking status: Never Smoker  . Smokeless tobacco: Never Used  Vaping Use  . Vaping Use: Never used  Substance and Sexual Activity  . Alcohol use: Not Currently  . Drug use:  Never  . Sexual activity: Yes    Birth control/protection: None  Other Topics Concern  . Not on file  Social History Narrative  . Not on file   Social Determinants of Health   Financial Resource Strain: Not on file  Food Insecurity: Not on file  Transportation Needs: Not on file  Physical Activity: Not on file  Stress: Not on file  Social Connections: Not on file  Intimate Partner Violence: Not on file    Allergies:  Allergies  Allergen Reactions  . Percocet [Oxycodone-Acetaminophen] Nausea And Vomiting    Medications: Prior to Admission medications   Medication Sig Start Date End Date Taking? Authorizing Provider  fluticasone (FLONASE) 50 MCG/ACT nasal spray Place 1 spray into both nostrils daily as needed for allergies or rhinitis.   Yes [provider]    Physical Exam Vitals: Blood pressure 118/80, weight 201 lb (91.2 kg), last menstrual period 03/14/2020.  General: NAD HEENT: normocephalic, anicteric Thyroid: no enlargement, no palpable nodules Pulmonary: No increased work of breathing, CTAB Cardiovascular: RRR, distal pulses 2+ Abdomen: NABS, soft, non-tender, non-distended.  Umbilicus without lesions.  No hepatomegaly, splenomegaly or masses palpable. No evidence of hernia  Genitourinary:  External: Normal external female genitalia.  Normal urethral meatus, normal  Bartholin's and Skene's glands.    Vagina: Normal vaginal mucosa, no evidence of prolapse.    Cervix: Grossly normal in appearance, no bleeding  Uterus: anteverted, Non-enlarged, mobile, normal contour.  No CMT  Adnexa: ovaries non-enlarged, no adnexal masses  Rectal: deferred Extremities: no edema, erythema, or tenderness Neurologic: Grossly intact Psychiatric: mood appropriate, affect full   Assessment: 30 y.o. G2P0010 at [redacted]w[redacted]d presenting to initiate prenatal care  Plan: 1) Avoid alcoholic beverages. 2) Patient encouraged not to smoke.  3) Discontinue the use of all non-medicinal  drugs and chemicals.  4) Take prenatal vitamins daily.  5) Nutrition, food safety (fish, cheese advisories, and high nitrite foods) and exercise discussed. 6) Hospital and practice style discussed with cross coverage system.  7) Genetic Screening, such as with 1st Trimester Screening, cell free fetal DNA, AFP testing, and Ultrasound, as well as with amniocentesis and CVS as appropriate, is discussed with patient. At the conclusion of today's visit patient undecided genetic testing 8) Patient is asked about travel to areas at risk for the [place] virus, and counseled to avoid travel and exposure to mosquitoes or sexual partners who may have themselves been exposed to the virus. Testing is discussed, and will be ordered as appropriate.  Discussed breastfeeding and the [facility]. Benefits of nursing covered.She is encouraged to take the online class, as well as the CBE class. They are undecided re MaternT testing. RTC in 4 weeks for blood work and ROB. Pap smear and cjultures today with Urine culture.  Mirna Mires, CNM  04/28/2020 2:47 PM

## 2020-04-30 LAB — CYTOLOGY - PAP
Chlamydia: NEGATIVE
Comment: NEGATIVE
Comment: NEGATIVE
Comment: NORMAL
Diagnosis: NEGATIVE
Neisseria Gonorrhea: NEGATIVE
Trichomonas: NEGATIVE

## 2020-04-30 LAB — CULTURE, OB URINE

## 2020-04-30 LAB — URINE CULTURE, OB REFLEX

## 2020-05-01 ENCOUNTER — Encounter: Payer: Self-pay | Admitting: Obstetrics

## 2020-05-01 DIAGNOSIS — Z3400 Encounter for supervision of normal first pregnancy, unspecified trimester: Secondary | ICD-10-CM | POA: Insufficient documentation

## 2020-05-01 DIAGNOSIS — Z3490 Encounter for supervision of normal pregnancy, unspecified, unspecified trimester: Secondary | ICD-10-CM | POA: Insufficient documentation

## 2020-05-09 ENCOUNTER — Other Ambulatory Visit: Payer: Self-pay | Admitting: Obstetrics

## 2020-05-09 DIAGNOSIS — O210 Mild hyperemesis gravidarum: Secondary | ICD-10-CM

## 2020-05-09 DIAGNOSIS — Z3A08 8 weeks gestation of pregnancy: Secondary | ICD-10-CM

## 2020-05-09 MED ORDER — ONDANSETRON 4 MG PO TBDP: 4.0000 mg | ORAL_TABLET | Freq: Four times a day (QID) | ORAL | 0 refills | Status: DC | PRN

## 2020-05-09 NOTE — Telephone Encounter (Signed)
Patient following up on my chart message. GN#[phone]

## 2020-05-09 NOTE — Telephone Encounter (Signed)
Patient advised message has been sent to provider. Mary Diaz is in the office, but with patients.

## 2020-05-16 ENCOUNTER — Other Ambulatory Visit: Payer: Self-pay

## 2020-05-16 DIAGNOSIS — O210 Mild hyperemesis gravidarum: Secondary | ICD-10-CM

## 2020-05-16 DIAGNOSIS — Z3A08 8 weeks gestation of pregnancy: Secondary | ICD-10-CM

## 2020-05-19 ENCOUNTER — Other Ambulatory Visit: Payer: Self-pay | Admitting: Obstetrics

## 2020-05-19 DIAGNOSIS — O210 Mild hyperemesis gravidarum: Secondary | ICD-10-CM

## 2020-05-19 DIAGNOSIS — Z3A08 8 weeks gestation of pregnancy: Secondary | ICD-10-CM

## 2020-05-19 MED ORDER — ONDANSETRON 4 MG PO TBDP: 4.0000 mg | ORAL_TABLET | Freq: Four times a day (QID) | ORAL | 0 refills | Status: DC | PRN

## 2020-05-19 NOTE — Telephone Encounter (Signed)
Pt calling for refill of zofran; ran out yesterday.  [phone]

## 2020-05-26 ENCOUNTER — Encounter: Payer: Self-pay | Admitting: Obstetrics and Gynecology

## 2020-05-26 ENCOUNTER — Ambulatory Visit (INDEPENDENT_AMBULATORY_CARE_PROVIDER_SITE_OTHER): Payer: BC Managed Care – PPO | Admitting: Obstetrics and Gynecology

## 2020-05-26 ENCOUNTER — Encounter: Payer: BC Managed Care – PPO | Admitting: Advanced Practice Midwife

## 2020-05-26 ENCOUNTER — Other Ambulatory Visit: Payer: Self-pay

## 2020-05-26 VITALS — BP 114/66 | Wt 199.0 lb

## 2020-05-26 DIAGNOSIS — Z3481 Encounter for supervision of other normal pregnancy, first trimester: Secondary | ICD-10-CM

## 2020-05-26 DIAGNOSIS — O219 Vomiting of pregnancy, unspecified: Secondary | ICD-10-CM | POA: Diagnosis not present

## 2020-05-26 DIAGNOSIS — Z3A10 10 weeks gestation of pregnancy: Secondary | ICD-10-CM | POA: Diagnosis not present

## 2020-05-26 DIAGNOSIS — Z3400 Encounter for supervision of normal first pregnancy, unspecified trimester: Secondary | ICD-10-CM

## 2020-05-26 DIAGNOSIS — Z3A08 8 weeks gestation of pregnancy: Secondary | ICD-10-CM

## 2020-05-26 DIAGNOSIS — O210 Mild hyperemesis gravidarum: Secondary | ICD-10-CM

## 2020-05-26 DIAGNOSIS — Z3480 Encounter for supervision of other normal pregnancy, unspecified trimester: Secondary | ICD-10-CM

## 2020-05-26 LAB — POCT URINALYSIS DIPSTICK OB
Glucose, UA: NEGATIVE
POC,PROTEIN,UA: NEGATIVE

## 2020-05-26 MED ORDER — ONDANSETRON 4 MG PO TBDP: 4.0000 mg | ORAL_TABLET | Freq: Three times a day (TID) | ORAL | 2 refills | Status: AC | PRN

## 2020-05-26 NOTE — Progress Notes (Signed)
Routine Prenatal Care Visit  Subjective  Mary Diaz is a 30 y.o. G2P0010 at [redacted]w[redacted]d being seen today for ongoing prenatal care.  She is currently monitored for the following issues for this low-risk pregnancy and has Menorrhagia with irregular cycle; Iron deficiency anemia due to chronic blood loss; RLQ abdominal pain; Endometriosis determined by laparoscopy; Supervision of normal first pregnancy, antepartum; and Nausea and vomiting during pregnancy on their problem list.  ----------------------------------------------------------------------------------- Patient reports nausea and vomiting. Zofran helps. She would like a refill.    . Vag. Bleeding: None.   . Leaking Fluid denies.  Dating ultrasound today confirms EDD (See report under NOTES from today) ----------------------------------------------------------------------------------- The following portions of the patient's history were reviewed and updated as appropriate: allergies, current medications, past family history, past medical history, past social history, past surgical history and problem list. Problem list updated.  Objective  Blood pressure 114/66, weight 199 lb (90.3 kg), last menstrual period 03/14/2020. Pregravid weight 196 lb (88.9 kg) Total Weight Gain 3 lb (1.361 kg) Urinalysis: Urine Protein Negative  Urine Glucose Negative  Fetal Status: Fetal Heart Rate (bpm): 170         General:  Alert, oriented and cooperative. Patient is in no acute distress.  Skin: Skin is warm and dry. No rash noted.   Cardiovascular: Normal heart rate noted  Respiratory: Normal respiratory effort, no problems with respiration noted  Abdomen: Soft, gravid, appropriate for gestational age.       Pelvic:  Cervical exam deferred        Extremities: Normal range of motion.     Mental Status: Normal mood and affect. Normal behavior. Normal judgment and thought content.   Assessment   30 y.o. G2P0010 at [redacted]w[redacted]d by  12/19/2020, by Last Menstrual  Period presenting for routine prenatal visit  Plan   SECOND Problems (from 04/28/20 to present)    Problem Noted Resolved   Nausea and vomiting during pregnancy 05/26/2020 by Conard Novak, MD No   Supervision of normal first pregnancy, antepartum 05/01/2020 by Mirna Mires, CNM No   Overview Signed 05/01/2020  1:45 PM by Mirna Mires, CNM     Clinic  Prenatal Labs  Dating  Blood type:     Genetic Screen 1 Screen:    AFP:     Quad:     NIPS: Antibody:   Anatomic [place]  Rubella:    GTT Early:               Third trimester:  RPR:     Flu vaccine  HBsAg:     TDaP vaccine                                               Rhogam: HIV:     Baby Food                                               GBS: (For PCN allergy, check sensitivities)  Contraception  [id number]  Circumcision    Pediatrician    Support Person                  Preterm labor symptoms and general obstetric precautions including but not limited to vaginal bleeding, contractions,  leaking of fluid and fetal movement were reviewed in detail with the patient. Please refer to After Visit Summary for other counseling recommendations.   - NOB as soon as possible - genetic screening discussed. Undecided.   Return in about 4 weeks (around 06/23/2020) for Routine Prenatal Appointment.   Thomasene Mohair, MD, [facility], [facility] 05/26/2020 2:52 PM

## 2020-05-27 ENCOUNTER — Encounter: Payer: Self-pay | Admitting: Obstetrics and Gynecology

## 2020-05-27 NOTE — Progress Notes (Signed)
ULTRASOUND REPORT  Location: [facility] Date of Service: 05/27/2020   Indications:dating Findings:  Mason Jim intrauterine pregnancy is visualized with a CRL consistent with [redacted]w[redacted]d gestation, giving an (U/S) EDD of 12/20/2020. The (U/S) EDD is consistent with the clinically established EDD of 12/19/2020.  FHR: 172 BPM CRL measurement: 3.35 cm Yolk sac is visualized and appears normal. Amnion: visualized and appears normal   Right Ovary is normal in appearance. Left Ovary is normal appearance. Corpus luteal cyst:  Right ovary Survey of the adnexa demonstrates no adnexal masses. There is no free peritoneal fluid in the cul de sac.  Impression: 1. [redacted]w[redacted]d Viable Singleton Intrauterine pregnancy by U/S. 2. (U/S) EDD is consistent with Clinically established EDD of 12/19/2020.  There is a viable singleton gestation.  Detailed evaluation of the fetal anatomy is precluded by early gestational age.  It must be noted that a normal ultrasound particular at this early gestational age is unable to rule out fetal aneuploidy, risk of first trimester miscarriage, or anatomic birth defects. I personally performed the ultrasound and interpreted it as noted within this report.   Thomasene Mohair, MD, [facility], [facility] 05/27/2020 8:46 PM

## 2020-06-27 ENCOUNTER — Ambulatory Visit (INDEPENDENT_AMBULATORY_CARE_PROVIDER_SITE_OTHER): Payer: BC Managed Care – PPO | Admitting: Obstetrics and Gynecology

## 2020-06-27 ENCOUNTER — Other Ambulatory Visit: Payer: Self-pay

## 2020-06-27 ENCOUNTER — Encounter: Payer: Self-pay | Admitting: Obstetrics and Gynecology

## 2020-06-27 VITALS — BP 124/70 | Wt 193.0 lb

## 2020-06-27 DIAGNOSIS — O219 Vomiting of pregnancy, unspecified: Secondary | ICD-10-CM

## 2020-06-27 DIAGNOSIS — Z3402 Encounter for supervision of normal first pregnancy, second trimester: Secondary | ICD-10-CM

## 2020-06-27 DIAGNOSIS — Z3480 Encounter for supervision of other normal pregnancy, unspecified trimester: Secondary | ICD-10-CM

## 2020-06-27 DIAGNOSIS — Z3A15 15 weeks gestation of pregnancy: Secondary | ICD-10-CM

## 2020-06-27 LAB — POCT URINALYSIS DIPSTICK OB
Glucose, UA: NEGATIVE
POC,PROTEIN,UA: NEGATIVE

## 2020-06-27 NOTE — Progress Notes (Signed)
Routine Prenatal Care Visit  Subjective  Mary Diaz is a 30 y.o. G2P0010 at [redacted]w[redacted]d being seen today for ongoing prenatal care.  She is currently monitored for the following issues for this low-risk pregnancy and has Menorrhagia with irregular cycle; Iron deficiency anemia due to chronic blood loss; RLQ abdominal pain; Endometriosis determined by laparoscopy; Supervision of normal first pregnancy, antepartum; and Nausea and vomiting during pregnancy on their problem list.  ----------------------------------------------------------------------------------- Patient reports no complaints.    . Vag. Bleeding: None.   . Leaking Fluid denies.  ----------------------------------------------------------------------------------- The following portions of the patient's history were reviewed and updated as appropriate: allergies, current medications, past family history, past medical history, past social history, past surgical history and problem list. Problem list updated.  Objective  Blood pressure 124/70, weight 193 lb (87.5 kg), last menstrual period 03/14/2020. Pregravid weight 196 lb (88.9 kg) Total Weight Gain -3 lb (-1.361 kg) Urinalysis: Urine Protein Negative  Urine Glucose Negative  Fetal Status: Fetal Heart Rate (bpm): 150         General:  Alert, oriented and cooperative. Patient is in no acute distress.  Skin: Skin is warm and dry. No rash noted.   Cardiovascular: Normal heart rate noted  Respiratory: Normal respiratory effort, no problems with respiration noted  Abdomen: Soft, gravid, appropriate for gestational age.       Pelvic:  Cervical exam deferred        Extremities: Normal range of motion.     Mental Status: Normal mood and affect. Normal behavior. Normal judgment and thought content.   Assessment   30 y.o. G2P0010 at [redacted]w[redacted]d by  12/19/2020, by Last Menstrual Period presenting for routine prenatal visit  Plan   SECOND Problems (from 04/28/20 to present)     Problem  Noted Resolved   Nausea and vomiting during pregnancy 05/26/2020 by Conard Novak, MD No   Supervision of normal first pregnancy, antepartum 05/01/2020 by Mirna Mires, CNM No   Overview Signed 05/01/2020  1:45 PM by Mirna Mires, CNM     Clinic  Prenatal Labs  Dating  Blood type:     Genetic Screen 1 Screen:    AFP:     Quad:     NIPS: Antibody:   Anatomic [place]  Rubella:    GTT Early:               Third trimester:  RPR:     Flu vaccine  HBsAg:     TDaP vaccine                                               Rhogam: HIV:     Baby Food                                               GBS: (For PCN allergy, check sensitivities)  Contraception  [id number]:[id number]  Circumcision    Pediatrician    Support Person                Preterm labor symptoms and general obstetric precautions including but not limited to vaginal bleeding, contractions, leaking of fluid and fetal movement were reviewed in detail with the patient. Please refer to After Visit Summary for  other counseling recommendations.   Return in about 4 weeks (around 07/25/2020) for Routine Prenatal Appointment.   Thomasene Mohair, MD, [facility], [facility] 06/27/2020 2:53 PM

## 2020-06-28 LAB — RPR+RH+ABO+RUB AB+AB SCR+CB...
Antibody Screen: NEGATIVE
HIV Screen 4th Generation wRfx: NONREACTIVE
Hematocrit: 37.3 % (ref 34.0–46.6)
Hemoglobin: 12.3 g/dL (ref 11.1–15.9)
Hepatitis B Surface Ag: NEGATIVE
MCH: 29.4 pg (ref 26.6–33.0)
MCHC: 33 g/dL (ref 31.5–35.7)
MCV: 89 fL (ref 79–97)
Platelets: 305 10*3/uL (ref 150–450)
RBC: 4.19 x10E6/uL (ref 3.77–5.28)
RDW: 12.2 % (ref 11.7–15.4)
RPR Ser Ql: NONREACTIVE
Rh Factor: POSITIVE
Rubella Antibodies, IGG: 1.37 index (ref >0.99)
Varicella zoster IgG: 294 index (ref >165)
WBC: 10.3 10*3/uL (ref 3.4–10.8)

## 2020-07-28 ENCOUNTER — Other Ambulatory Visit: Payer: Self-pay

## 2020-07-28 ENCOUNTER — Ambulatory Visit (INDEPENDENT_AMBULATORY_CARE_PROVIDER_SITE_OTHER): Payer: BC Managed Care – PPO | Admitting: Obstetrics and Gynecology

## 2020-07-28 VITALS — BP 120/74 | HR 86 | Wt 194.0 lb

## 2020-07-28 DIAGNOSIS — Z3400 Encounter for supervision of normal first pregnancy, unspecified trimester: Secondary | ICD-10-CM

## 2020-07-28 DIAGNOSIS — Z3A19 19 weeks gestation of pregnancy: Secondary | ICD-10-CM

## 2020-07-28 DIAGNOSIS — R002 Palpitations: Secondary | ICD-10-CM

## 2020-07-28 DIAGNOSIS — Z369 Encounter for antenatal screening, unspecified: Secondary | ICD-10-CM

## 2020-07-28 DIAGNOSIS — O219 Vomiting of pregnancy, unspecified: Secondary | ICD-10-CM

## 2020-07-28 NOTE — Progress Notes (Signed)
ROB - having palpitations off and on. RM 5

## 2020-07-28 NOTE — Progress Notes (Signed)
    Routine Prenatal Care Visit  Subjective  Mary Diaz is a 30 y.o. G2P0010 at [redacted]w[redacted]d being seen today for ongoing prenatal care.  She is currently monitored for the following issues for this low-risk pregnancy and has Menorrhagia with irregular cycle; Iron deficiency anemia due to chronic blood loss; RLQ abdominal pain; Endometriosis determined by laparoscopy; Supervision of normal first pregnancy, antepartum; and Nausea and vomiting during pregnancy on their problem list.  ----------------------------------------------------------------------------------- Patient reports  palpiations.  Most pronounced with position changes when going at work. Has also noted when walking to the car in hot weather.  Has had some associated nausea and emesis.  No chest pain.  No prior cardiac history.  No history of thyroid problems .   Contractions: Not present. Vag. Bleeding: None.  Movement: Present. Denies leaking of fluid.  ----------------------------------------------------------------------------------- The following portions of the patient's history were reviewed and updated as appropriate: allergies, current medications, past family history, past medical history, past social history, past surgical history and problem list. Problem list updated.   Objective  Blood pressure 120/74, pulse 86, weight 194 lb (88 kg), last menstrual period 03/14/2020. Pregravid weight 196 lb (88.9 kg) Total Weight Gain -2 lb (-0.907 kg) Urinalysis:      Fetal Status: Fetal Heart Rate (bpm): 145   Movement: Present     General:  Alert, oriented and cooperative. Patient is in no acute distress.  Skin: Skin is warm and dry. No rash noted.   Cardiovascular: Normal heart rate noted  Respiratory: Normal respiratory effort, no problems with respiration noted  Abdomen: Soft, gravid, appropriate for gestational age. Pain/Pressure: Absent     Pelvic:  Cervical exam deferred        Extremities: Normal range of motion.      ental Status: Normal mood and affect. Normal behavior. Normal judgment and thought content.     Assessment   30 y.o. G2P0010 at [redacted]w[redacted]d by  12/19/2020, by Last Menstrual Period presenting for routine prenatal visit  Plan    Gestational age appropriate obstetric precautions including but not limited to vaginal bleeding, contractions, leaking of fluid and fetal movement were reviewed in detail with the patient.    1) Palpitation - could be some degree of caval compression already, in addition would expect a nadir in BP around 20 weeks.  No prior cardiac history. - will refer to cardiology for evaluation of possible Holter if indicated - TSH future ordered - Recent CBC no evidence of anemia  2) Anatomy scan - scheduled for next week  Return KEEP ROB NEXT WEEK.  Vena Austria, MD, [facility], [facility] 07/28/2020, 4:41 PM

## 2020-08-07 ENCOUNTER — Ambulatory Visit
Admission: RE | Admit: 2020-08-07 | Discharge: 2020-08-07 | Disposition: A | Discharge status: Home / Self Care | Payer: BC Managed Care – PPO | Source: Ambulatory Visit | Attending: Obstetrics and Gynecology | Admitting: Obstetrics and Gynecology

## 2020-08-07 ENCOUNTER — Other Ambulatory Visit: Payer: Self-pay

## 2020-08-07 DIAGNOSIS — Z3402 Encounter for supervision of normal first pregnancy, second trimester: Secondary | ICD-10-CM | POA: Diagnosis not present

## 2020-08-08 ENCOUNTER — Encounter: Payer: Self-pay | Admitting: Obstetrics and Gynecology

## 2020-08-08 ENCOUNTER — Ambulatory Visit (INDEPENDENT_AMBULATORY_CARE_PROVIDER_SITE_OTHER): Payer: BC Managed Care – PPO | Admitting: Obstetrics and Gynecology

## 2020-08-08 VITALS — BP 122/78 | Wt 195.0 lb

## 2020-08-08 DIAGNOSIS — O219 Vomiting of pregnancy, unspecified: Secondary | ICD-10-CM

## 2020-08-08 DIAGNOSIS — Z3402 Encounter for supervision of normal first pregnancy, second trimester: Secondary | ICD-10-CM

## 2020-08-08 DIAGNOSIS — Z3A21 21 weeks gestation of pregnancy: Secondary | ICD-10-CM

## 2020-08-08 LAB — POCT URINALYSIS DIPSTICK OB
Glucose, UA: NEGATIVE
POC,PROTEIN,UA: NEGATIVE

## 2020-08-08 NOTE — Progress Notes (Signed)
Routine Prenatal Care Visit  Subjective  Mary Diaz is a 30 y.o. G2P0010 at [redacted]w[redacted]d being seen today for ongoing prenatal care.  She is currently monitored for the following issues for this low-risk pregnancy and has Menorrhagia with irregular cycle; Iron deficiency anemia due to chronic blood loss; RLQ abdominal pain; Endometriosis determined by laparoscopy; Supervision of normal first pregnancy, antepartum; and Nausea and vomiting during pregnancy on their problem list.  ----------------------------------------------------------------------------------- Patient reports no complaints.   Contractions: Not present. Vag. Bleeding: None.  Movement: Present. Leaking Fluid denies.  ----------------------------------------------------------------------------------- The following portions of the patient's history were reviewed and updated as appropriate: allergies, current medications, past family history, past medical history, past social history, past surgical history and problem list. Problem list updated.  Objective  Blood pressure 122/78, weight 195 lb (88.5 kg), last menstrual period 03/14/2020. Pregravid weight 196 lb (88.9 kg) Total Weight Gain -1 lb (-0.454 kg) Urinalysis: Urine Protein Negative  Urine Glucose Negative  Fetal Status: Fetal Heart Rate (bpm): 145   Movement: Present     General:  Alert, oriented and cooperative. Patient is in no acute distress.  Skin: Skin is warm and dry. No rash noted.   Cardiovascular: Normal heart rate noted  Respiratory: Normal respiratory effort, no problems with respiration noted  Abdomen: Soft, gravid, appropriate for gestational age. Pain/Pressure: Absent     Pelvic:  Cervical exam deferred        Extremities: Normal range of motion.     Mental Status: Normal mood and affect. Normal behavior. Normal judgment and thought content.   Assessment   30 y.o. G2P0010 at [redacted]w[redacted]d by  12/19/2020, by Last Menstrual Period presenting for routine prenatal  visit  Plan   SECOND Problems (from 04/28/20 to present)     Problem Noted Resolved   Nausea and vomiting during pregnancy 05/26/2020 by Conard Novak, MD No   Supervision of normal first pregnancy, antepartum 05/01/2020 by Mirna Mires, CNM No   Overview Signed 05/01/2020  1:45 PM by Mirna Mires, CNM     Clinic  Prenatal Labs  Dating  Blood type:     Genetic Screen 1 Screen:    AFP:     Quad:     NIPS: Antibody:   Anatomic [place]  Rubella:    GTT Early:               Third trimester:  RPR:     Flu vaccine  HBsAg:     TDaP vaccine                                               Rhogam: HIV:     Baby Food                                               GBS: (For PCN allergy, check sensitivities)  Contraception  [id number]  Circumcision    Pediatrician    Support Person                   Preterm labor symptoms and general obstetric precautions including but not limited to vaginal bleeding, contractions, leaking of fluid and fetal movement were reviewed in detail with the patient. Please refer to  After Visit Summary for other counseling recommendations.   - anatomy u/s incomplete for RVOT. Follow up ultrasound ordered.   Return in about 4 weeks (around 09/05/2020) for Routine Prenatal Appointment.   Thomasene Mohair, MD, [facility], [facility] 08/08/2020 4:36 PM

## 2020-09-05 ENCOUNTER — Other Ambulatory Visit: Payer: Self-pay

## 2020-09-05 ENCOUNTER — Ambulatory Visit
Admission: RE | Admit: 2020-09-05 | Discharge: 2020-09-05 | Disposition: A | Discharge status: Home / Self Care | Payer: BC Managed Care – PPO | Source: Ambulatory Visit | Attending: Obstetrics and Gynecology | Admitting: Obstetrics and Gynecology

## 2020-09-05 ENCOUNTER — Encounter: Payer: BC Managed Care – PPO | Admitting: Advanced Practice Midwife

## 2020-09-05 DIAGNOSIS — Z3402 Encounter for supervision of normal first pregnancy, second trimester: Secondary | ICD-10-CM | POA: Diagnosis not present

## 2020-09-09 ENCOUNTER — Other Ambulatory Visit: Payer: Self-pay

## 2020-09-09 ENCOUNTER — Ambulatory Visit (INDEPENDENT_AMBULATORY_CARE_PROVIDER_SITE_OTHER): Payer: BC Managed Care – PPO | Admitting: Advanced Practice Midwife

## 2020-09-09 VITALS — BP 100/70 | Wt 196.0 lb

## 2020-09-09 DIAGNOSIS — Z3482 Encounter for supervision of other normal pregnancy, second trimester: Secondary | ICD-10-CM

## 2020-09-09 DIAGNOSIS — Z130 Encounter for screening for diseases of the blood and blood-forming organs and certain disorders involving the immune mechanism: Secondary | ICD-10-CM

## 2020-09-09 DIAGNOSIS — Z369 Encounter for antenatal screening, unspecified: Secondary | ICD-10-CM

## 2020-09-09 DIAGNOSIS — Z113 Encounter for screening for infections with a predominantly sexual mode of transmission: Secondary | ICD-10-CM

## 2020-09-09 DIAGNOSIS — Z3A25 25 weeks gestation of pregnancy: Secondary | ICD-10-CM

## 2020-09-09 DIAGNOSIS — Z131 Encounter for screening for diabetes mellitus: Secondary | ICD-10-CM

## 2020-09-09 LAB — POCT URINALYSIS DIPSTICK OB
Glucose, UA: NEGATIVE
POC,PROTEIN,UA: NEGATIVE

## 2020-09-09 NOTE — Progress Notes (Signed)
Routine Prenatal Care Visit  Subjective  Mary Diaz is a 30 y.o. G2P0010 at [redacted]w[redacted]d being seen today for ongoing prenatal care.  She is currently monitored for the following issues for this low-risk pregnancy and has Menorrhagia with irregular cycle; Iron deficiency anemia due to chronic blood loss; Endometriosis determined by laparoscopy; Supervision of normal pregnancy; and Nausea and vomiting during pregnancy on their problem list.  ----------------------------------------------------------------------------------- Patient reports no complaints. She had a follow up anatomy scan 4 days ago. We discussed results and ideal weight gain and diet recommendations. She has been eating smaller portions and is just now back to pre-pregnant weight.  Recommend increase frequency of small meals and focus on healthy foods and staying physically active. Fetus is growing appropriately.  Contractions: Not present. Vag. Bleeding: None.  Movement: Present. Leaking Fluid denies.  ----------------------------------------------------------------------------------- The following portions of the patient's history were reviewed and updated as appropriate: allergies, current medications, past family history, past medical history, past social history, past surgical history and problem list. Problem list updated.  Objective  Blood pressure 100/70, weight 196 lb (88.9 kg), last menstrual period 03/14/2020. Pregravid weight 196 lb (88.9 kg) Total Weight Gain 0 lb (0 kg) Urinalysis: Urine Protein Negative  Urine Glucose Negative  Fetal Status: Fetal Heart Rate (bpm): 139 Fundal Height: 26 cm Movement: Present      Anatomy scan done on 09/05/20: 68%, anatomy complete  General:  Alert, oriented and cooperative. Patient is in no acute distress.  Skin: Skin is warm and dry. No rash noted.   Cardiovascular: Normal heart rate noted  Respiratory: Normal respiratory effort, no problems with respiration noted  Abdomen: Soft,  gravid, appropriate for gestational age. Pain/Pressure: Absent     Pelvic:  Cervical exam deferred        Extremities: Normal range of motion.     Mental Status: Normal mood and affect. Normal behavior. Normal judgment and thought content.   Assessment   30 y.o. G2P0010 at [redacted]w[redacted]d by  12/19/2020, by Last Menstrual Period presenting for routine prenatal visit  Plan   SECOND Problems (from 04/28/20 to present)    Problem Noted Resolved   Nausea and vomiting during pregnancy 05/26/2020 by Conard Novak, MD No   Supervision of normal pregnancy 05/01/2020 by Mirna Mires, CNM No   Overview Signed 05/01/2020  1:45 PM by Mirna Mires, CNM     Clinic  Prenatal Labs  Dating  Blood type:     Genetic Screen 1 Screen:    AFP:     Quad:     NIPS: Antibody:   Anatomic [place]  Rubella:    GTT Early:               Third trimester:  RPR:     Flu vaccine  HBsAg:     TDaP vaccine                                               Rhogam: HIV:     Baby Food                                               GBS: (For PCN allergy, check sensitivities)  Contraception  ZDG:[id number]  Circumcision  Pediatrician    Support Person                  Preterm labor symptoms and general obstetric precautions including but not limited to vaginal bleeding, contractions, leaking of fluid and fetal movement were reviewed in detail with the patient.   Return in about 3 weeks (around 09/30/2020) for 28 wk labs and rob.  Tresea Mall, CNM 09/09/2020 4:34 PM

## 2020-09-15 ENCOUNTER — Ambulatory Visit: Payer: BC Managed Care – PPO | Admitting: Cardiology

## 2020-09-15 ENCOUNTER — Encounter: Payer: Self-pay | Admitting: Cardiology

## 2020-09-15 ENCOUNTER — Other Ambulatory Visit: Payer: Self-pay

## 2020-09-15 ENCOUNTER — Ambulatory Visit (INDEPENDENT_AMBULATORY_CARE_PROVIDER_SITE_OTHER): Payer: BC Managed Care – PPO

## 2020-09-15 VITALS — BP 120/70 | HR 103 | Ht 64.0 in | Wt 196.0 lb

## 2020-09-15 DIAGNOSIS — R002 Palpitations: Secondary | ICD-10-CM

## 2020-09-15 DIAGNOSIS — R0602 Shortness of breath: Secondary | ICD-10-CM

## 2020-09-15 DIAGNOSIS — Z331 Pregnant state, incidental: Secondary | ICD-10-CM

## 2020-09-15 DIAGNOSIS — Z3490 Encounter for supervision of normal pregnancy, unspecified, unspecified trimester: Secondary | ICD-10-CM

## 2020-09-15 NOTE — Progress Notes (Signed)
Cardiology Office Note:    Date:  09/15/2020   ID:  Lenard Galloway, DOB December 31, 1990, MRN [id number]  PCP:  Pcp, No   [facility] Providers Cardiologist:  None     Referring MD: Vena Austria, MD   Chief Complaint  Patient presents with   Other    Referred by Dr. Bonney Aid for palpitations and supervision of normal first pregnancy, antepartum. Meds reviewed verbally with patient.    KELIS PLASSE is a 30 y.o. female who is being seen today for the evaluation of palpitations at the request of Vena Austria, MD.   History of Present Illness:    LATONDRA GEBHART is a 30 y.o. female with a hx of anemia, currently [redacted] weeks pregnant who presents due to palpitations.  Patient states having palpitations previously prior to being pregnant, this resolved after she cut back on caffeine.  She has noticed palpitations more frequently while being pregnant.  Symptoms are not associated with exertion, but she will occasionally gets out of breath with exertion.  This summer heat made palpitations worse.  She denies chest pain.  She will occasionally feel dizzy with palpitations.  Denies any history of heart disease, has no other concerns at this time.  Past Medical History:  Diagnosis Date   Anemia    Endometriosis    Ovarian cyst     Past Surgical History:  Procedure Laterality Date   LAPAROSCOPIC OVARIAN CYSTECTOMY Right 07/04/2018   Procedure: RIGHT LAPAROSCOPIC OVARIAN CYSTECTOMY with peritoneal biopsies;  Surgeon: Nadara Mustard, MD;  Location: [facility] ORS;  Service: Gynecology;  Laterality: Right;    Current Medications: Current Meds  Medication Sig   fluticasone (FLONASE) 50 MCG/ACT nasal spray Place 1 spray into both nostrils daily as needed for allergies or rhinitis.   ondansetron (ZOFRAN ODT) 4 MG disintegrating tablet Take 1 tablet (4 mg total) by mouth every 8 (eight) hours as needed for nausea.   Prenatal Vit-Fe Fumarate-FA (MULTIVITAMIN-PRENATAL) 27-0.8 MG TABS tablet  Take 1 tablet by mouth daily at 12 noon.     Allergies:   Percocet [oxycodone-acetaminophen]   Social History   Socioeconomic History   Marital status: Married    Spouse name: Not on file   Number of children: Not on file   Years of education: Not on file   Highest education level: Not on file  Occupational History   Not on file  Tobacco Use   Smoking status: Never   Smokeless tobacco: Never  Vaping Use   Vaping Use: Never used  Substance and Sexual Activity   Alcohol use: Not Currently   Drug use: Never   Sexual activity: Yes    Birth control/protection: None  Other Topics Concern   Not on file  Social History Narrative   Not on file   Social Determinants of Health   Financial Resource Strain: Not on file  Food Insecurity: Not on file  Transportation Needs: Not on file  Physical Activity: Not on file  Stress: Not on file  Social Connections: Not on file     Family History: The patient's family history is not on file. She was adopted.  ROS:   Please see the history of present illness.     All other systems reviewed and are negative.  EKGs/Labs/Other Studies Reviewed:    The following studies were reviewed today:   EKG:  EKG is  ordered today.  The ekg ordered today demonstrates sinus tachycardia, heart rate 103, otherwise normal ECG.  Recent  Labs: 06/27/2020: Hemoglobin 12.3; Platelets 305  Recent Lipid Panel No results found for: CHOL, TRIG, HDL, CHOLHDL, VLDL, LDLCALC, LDLDIRECT   Risk Assessment/Calculations:          Physical Exam:    VS:  BP 120/70 (BP Location: Left Arm, Patient Position: Sitting, Cuff Size: Normal)   Pulse (!) 103   Ht 5\' 4"  (1.626 m)   Wt 196 lb (88.9 kg)   LMP 03/14/2020 (Exact Date)   SpO2 99%   BMI 33.64 kg/m     Wt Readings from Last 3 Encounters:  09/15/20 196 lb (88.9 kg)  09/09/20 196 lb (88.9 kg)  08/08/20 195 lb (88.5 kg)     GEN:  Well nourished, well developed in no acute distress HEENT:  Normal NECK: No JVD; No carotid bruits LYMPHATICS: No lymphadenopathy CARDIAC: RRR, no murmurs, rubs, gallops RESPIRATORY:  Clear to auscultation without rales, wheezing or rhonchi  ABDOMEN: Soft, non-tender, non-distended MUSCULOSKELETAL:  No edema; No deformity  SKIN: Warm and dry NEUROLOGIC:  Alert and oriented x 3 PSYCHIATRIC:  Normal affect   ASSESSMENT:    1. Palpitations   2. Shortness of breath   3. Pregnancy, unspecified gestational age    PLAN:    In order of problems listed above:  Palpitations, likely has PACs/SVE's secondary to atrial stretching because of volume overload associated with pregnancy.  Place cardiac monitor to evaluate any significant arrhythmias. Shortness of breath with exertion.  Obtain echocardiogram to evaluate any structural abnormalities.  Could be pregnancy-induced. [redacted] weeks pregnant, management as per OB.  Follow-up after echo and cardiac monitor.     Medication Adjustments/Labs and Tests Ordered: Current medicines are reviewed at length with the patient today.  Concerns regarding medicines are outlined above.  Orders Placed This Encounter  Procedures   LONG TERM MONITOR (3-14 DAYS)   EKG 12-Lead   ECHOCARDIOGRAM COMPLETE    No orders of the defined types were placed in this encounter.   Patient Instructions  Medication Instructions:  Your physician recommends that you continue on your current medications as directed. Please refer to the Current Medication list given to you today.  *If you need a refill on your cardiac medications before your next appointment, please call your pharmacy*   Lab Work: None ordered If you have labs (blood work) drawn today and your tests are completely normal, you will receive your results only by: MyChart Message (if you have MyChart) OR A paper copy in the mail If you have any lab test that is abnormal or we need to change your treatment, we will call you to review the  results.   Testing/Procedures:   Your physician has requested that you have an echocardiogram. Echocardiography is a painless test that uses sound waves to create images of your heart. It provides your doctor with information about the size and shape of your heart and how well your heart's chambers and valves are working. This procedure takes approximately one hour. There are no restrictions for this procedure.  2.   Your physician has recommended that you wear a Zio monitor.   This monitor is a medical device that records the heart's electrical activity. Doctors most often use these monitors to diagnose arrhythmias. Arrhythmias are problems with the speed or rhythm of the heartbeat. The monitor is a small device applied to your chest. You can wear one while you do your normal daily activities. While wearing this monitor if you have any symptoms to push the button and record  what you felt. Once you have worn this monitor for the period of time provider prescribed (Usually 14 days), you will return the monitor device in the postage paid box. Once it is returned they will download the data collected and provide [place] with a report which the provider will then review and we will call you with those results. Important tips:  Avoid showering during the first 24 hours of wearing the monitor. Avoid excessive sweating to help maximize wear time. Do not submerge the device, no hot tubs, and no swimming pools. Keep any lotions or oils away from the patch. After 24 hours you may shower with the patch on. Take brief showers with your back facing the shower head.  Do not remove patch once it has been placed because that will interrupt data and decrease adhesive wear time. Push the button when you have any symptoms and write down what you were feeling. Once you have completed wearing your monitor, remove and place into box which has postage paid and place in your outgoing mailbox.  If for some reason you have  misplaced your box then call our office and we can provide another box and/or mail it off for you.      Follow-Up: At [facility], you and your health needs are our priority.  As part of our continuing mission to provide you with exceptional heart care, we have created designated Provider Care Teams.  These Care Teams include your primary Cardiologist (physician) and Advanced Practice Providers (APPs -  Physician Assistants and Nurse Practitioners) who all work together to provide you with the care you need, when you need it.  We recommend signing up for the patient portal called "MyChart".  Sign up information is provided on this After Visit Summary.  MyChart is used to connect with patients for Virtual Visits (Telemedicine).  Patients are able to view lab/test results, encounter notes, upcoming appointments, etc.  Non-urgent messages can be sent to your provider as well.   To learn more about what you can do with MyChart, go to [email or web address].    Your next appointment:   6 week(s)  The format for your next appointment:   In Person  Provider:   You may see Dr. Azucena Cecil or one of the following Advanced Practice Providers on your designated Care Team:   Nicolasa Ducking, NP Eula Listen, PA-C Marisue Ivan, PA-C Cadence [place], [place]   Other Instructions    Signed, Debbe Odea, MD  09/15/2020 4:24 PM    [facility]

## 2020-09-15 NOTE — Patient Instructions (Signed)
Medication Instructions:  Your physician recommends that you continue on your current medications as directed. Please refer to the Current Medication list given to you today.  *If you need a refill on your cardiac medications before your next appointment, please call your pharmacy*   Lab Work: None ordered If you have labs (blood work) drawn today and your tests are completely normal, you will receive your results only by: MyChart Message (if you have MyChart) OR A paper copy in the mail If you have any lab test that is abnormal or we need to change your treatment, we will call you to review the results.   Testing/Procedures:   Your physician has requested that you have an echocardiogram. Echocardiography is a painless test that uses sound waves to create images of your heart. It provides your doctor with information about the size and shape of your heart and how well your heart's chambers and valves are working. This procedure takes approximately one hour. There are no restrictions for this procedure.  2.   Your physician has recommended that you wear a Zio monitor.   This monitor is a medical device that records the heart's electrical activity. Doctors most often use these monitors to diagnose arrhythmias. Arrhythmias are problems with the speed or rhythm of the heartbeat. The monitor is a small device applied to your chest. You can wear one while you do your normal daily activities. While wearing this monitor if you have any symptoms to push the button and record what you felt. Once you have worn this monitor for the period of time provider prescribed (Usually 14 days), you will return the monitor device in the postage paid box. Once it is returned they will download the data collected and provide [place] with a report which the provider will then review and we will call you with those results. Important tips:  Avoid showering during the first 24 hours of wearing the monitor. Avoid excessive  sweating to help maximize wear time. Do not submerge the device, no hot tubs, and no swimming pools. Keep any lotions or oils away from the patch. After 24 hours you may shower with the patch on. Take brief showers with your back facing the shower head.  Do not remove patch once it has been placed because that will interrupt data and decrease adhesive wear time. Push the button when you have any symptoms and write down what you were feeling. Once you have completed wearing your monitor, remove and place into box which has postage paid and place in your outgoing mailbox.  If for some reason you have misplaced your box then call our office and we can provide another box and/or mail it off for you.      Follow-Up: At [facility], you and your health needs are our priority.  As part of our continuing mission to provide you with exceptional heart care, we have created designated Provider Care Teams.  These Care Teams include your primary Cardiologist (physician) and Advanced Practice Providers (APPs -  Physician Assistants and Nurse Practitioners) who all work together to provide you with the care you need, when you need it.  We recommend signing up for the patient portal called "MyChart".  Sign up information is provided on this After Visit Summary.  MyChart is used to connect with patients for Virtual Visits (Telemedicine).  Patients are able to view lab/test results, encounter notes, upcoming appointments, etc.  Non-urgent messages can be sent to your provider as well.  To learn more about what you can do with MyChart, go to [email or web address].    Your next appointment:   6 week(s)  The format for your next appointment:   In Person  Provider:   You may see Dr. Azucena Cecil or one of the following Advanced Practice Providers on your designated Care Team:   Nicolasa Ducking, NP Eula Listen, PA-C Marisue Ivan, PA-C Cadence [place], [place]   Other Instructions

## 2020-09-30 ENCOUNTER — Ambulatory Visit (INDEPENDENT_AMBULATORY_CARE_PROVIDER_SITE_OTHER): Payer: BC Managed Care – PPO | Admitting: Obstetrics

## 2020-09-30 ENCOUNTER — Other Ambulatory Visit: Payer: BC Managed Care – PPO

## 2020-09-30 ENCOUNTER — Other Ambulatory Visit: Payer: Self-pay

## 2020-09-30 VITALS — BP 112/72 | Ht 64.0 in | Wt 198.2 lb

## 2020-09-30 DIAGNOSIS — Z3482 Encounter for supervision of other normal pregnancy, second trimester: Secondary | ICD-10-CM

## 2020-09-30 DIAGNOSIS — Z131 Encounter for screening for diabetes mellitus: Secondary | ICD-10-CM

## 2020-09-30 DIAGNOSIS — R002 Palpitations: Secondary | ICD-10-CM

## 2020-09-30 DIAGNOSIS — Z3483 Encounter for supervision of other normal pregnancy, third trimester: Secondary | ICD-10-CM

## 2020-09-30 DIAGNOSIS — Z3A28 28 weeks gestation of pregnancy: Secondary | ICD-10-CM

## 2020-09-30 DIAGNOSIS — O219 Vomiting of pregnancy, unspecified: Secondary | ICD-10-CM

## 2020-09-30 DIAGNOSIS — Z130 Encounter for screening for diseases of the blood and blood-forming organs and certain disorders involving the immune mechanism: Secondary | ICD-10-CM

## 2020-09-30 DIAGNOSIS — Z113 Encounter for screening for infections with a predominantly sexual mode of transmission: Secondary | ICD-10-CM

## 2020-09-30 DIAGNOSIS — Z369 Encounter for antenatal screening, unspecified: Secondary | ICD-10-CM

## 2020-09-30 NOTE — Progress Notes (Signed)
Routine Prenatal Care Visit  Subjective  Mary Diaz is a 30 y.o. G2P0010 at [redacted]w[redacted]d being seen today for ongoing prenatal care.  She is currently monitored for the following issues for this low-risk pregnancy and has Menorrhagia with irregular cycle; Iron deficiency anemia due to chronic blood loss; Endometriosis determined by laparoscopy; Supervision of normal pregnancy; and Nausea and vomiting during pregnancy on their problem list.  ----------------------------------------------------------------------------------- Patient reports no complaints.   Contractions: Not present. Vag. Bleeding: None.  Movement: Present. Leaking Fluid denies.  ----------------------------------------------------------------------------------- The following portions of the patient's history were reviewed and updated as appropriate: allergies, current medications, past family history, past medical history, past social history, past surgical history and problem list. Problem list updated.  Objective  Blood pressure 112/72, height 5\' 4"  (1.626 m), weight 198 lb 3.2 oz (89.9 kg), last menstrual period 03/14/2020. Pregravid weight 196 lb (88.9 kg) Total Weight Gain 2 lb 3.2 oz (0.998 kg) Urinalysis: Urine Protein    Urine Glucose    Fetal Status:     Movement: Present     General:  Alert, oriented and cooperative. Patient is in no acute distress.  Skin: Skin is warm and dry. No rash noted.   Cardiovascular: Normal heart rate noted  Respiratory: Normal respiratory effort, no problems with respiration noted  Abdomen: Soft, gravid, appropriate for gestational age. Pain/Pressure: Present     Pelvic:  Cervical exam deferred        Extremities: Normal range of motion.     Mental Status: Normal mood and affect. Normal behavior. Normal judgment and thought content.   Assessment   30 y.o. G2P0010 at [redacted]w[redacted]d by  12/19/2020, by Last Menstrual Period presenting for routine prenatal visit  Plan   SECOND Problems (from  04/28/20 to present)    Problem Noted Resolved   Nausea and vomiting during pregnancy 05/26/2020 by 05/28/2020, MD No   Supervision of normal pregnancy 05/01/2020 by 05/03/2020, CNM No   Overview Addendum 09/30/2020  9:24 AM by 10/02/2020, CNM     Clinic  Prenatal Labs  Dating  Blood type: AB/Positive/-- (06/24 1457)   Genetic Screen 1 Screen:    AFP:     Quad:     NIPS: Antibody:Negative (06/24 1457)  Anatomic 12-12-1986  Rubella: 1.37 (06/24 1457)  GTT Early:               Third trimester: 9/27 RPR: Non Reactive (06/24 1457)   Flu vaccine  HBsAg: Negative (06/24 1457)   TDaP vaccine                                               Rhogam: HIV: Non Reactive (06/24 1457)   Baby Food                                               GBS: (For PCN allergy, check sensitivities)  Contraception  12-12-1986  Circumcision    Pediatrician    Support Person                  Preterm labor symptoms and general obstetric precautions including but not lim 28 week labs today. She has opted to pum her breastmilk and not  nurse directly- discussed the benefits and risk associated with this.  Return in about 1 week (around 10/07/2020) for return OB.  Mirna Mires, CNM  09/30/2020 9:25 AM

## 2020-09-30 NOTE — Addendum Note (Signed)
Addended by: Clement Husbands A on: 09/30/2020 11:05 AM   Modules accepted: Orders

## 2020-10-01 LAB — 28 WEEK RH+PANEL
Basophils Absolute: 0 10*3/uL (ref 0.0–0.2)
Basos: 0 %
EOS (ABSOLUTE): 0.1 10*3/uL (ref 0.0–0.4)
Eos: 1 %
Gestational Diabetes Screen: 85 mg/dL (ref 65–139)
HIV Screen 4th Generation wRfx: NONREACTIVE
Hematocrit: 33.6 % — ABNORMAL LOW (ref 34.0–46.6)
Hemoglobin: 10.9 g/dL — ABNORMAL LOW (ref 11.1–15.9)
Immature Grans (Abs): 0.1 10*3/uL (ref 0.0–0.1)
Immature Granulocytes: 1 %
Lymphocytes Absolute: 1.2 10*3/uL (ref 0.7–3.1)
Lymphs: 13 %
MCH: 28.9 pg (ref 26.6–33.0)
MCHC: 32.4 g/dL (ref 31.5–35.7)
MCV: 89 fL (ref 79–97)
Monocytes Absolute: 0.6 10*3/uL (ref 0.1–0.9)
Monocytes: 7 %
Neutrophils Absolute: 7.3 10*3/uL — ABNORMAL HIGH (ref 1.4–7.0)
Neutrophils: 78 %
Platelets: 257 10*3/uL (ref 150–450)
RBC: 3.77 x10E6/uL (ref 3.77–5.28)
RDW: 11.8 % (ref 11.7–15.4)
RPR Ser Ql: NONREACTIVE
WBC: 9.3 10*3/uL (ref 3.4–10.8)

## 2020-10-01 LAB — TSH: TSH: 1.18 u[IU]/mL (ref 0.450–4.500)

## 2020-10-14 ENCOUNTER — Ambulatory Visit (INDEPENDENT_AMBULATORY_CARE_PROVIDER_SITE_OTHER): Payer: BC Managed Care – PPO | Admitting: Advanced Practice Midwife

## 2020-10-14 ENCOUNTER — Other Ambulatory Visit: Payer: Self-pay

## 2020-10-14 ENCOUNTER — Encounter: Payer: Self-pay | Admitting: Advanced Practice Midwife

## 2020-10-14 VITALS — BP 122/76 | Wt 198.0 lb

## 2020-10-14 DIAGNOSIS — Z3483 Encounter for supervision of other normal pregnancy, third trimester: Secondary | ICD-10-CM

## 2020-10-14 DIAGNOSIS — Z3A30 30 weeks gestation of pregnancy: Secondary | ICD-10-CM

## 2020-10-14 DIAGNOSIS — Z23 Encounter for immunization: Secondary | ICD-10-CM | POA: Diagnosis not present

## 2020-10-14 NOTE — Progress Notes (Signed)
ROB - brown discharge, cramping/pain (stomach). RM 4

## 2020-10-14 NOTE — Progress Notes (Addendum)
Routine Prenatal Care Visit  Subjective  Mary Diaz is a 30 y.o. G2P0010 at [redacted]w[redacted]d being seen today for ongoing prenatal care.  She is currently monitored for the following issues for this low-risk pregnancy and has Menorrhagia with irregular cycle; Iron deficiency anemia due to chronic blood loss; Endometriosis determined by laparoscopy; Supervision of normal pregnancy; and Nausea and vomiting during pregnancy on their problem list.  ----------------------------------------------------------------------------------- Patient reports brown discharge on underwear and when wiping for 1 day last weekend. The next day she noticed a small amount of pink/yellow mucous. She has been cramping every 30 to 60 minutes since then. She was in [place] over the weekend and walking more than usual. She reports normally good hydration.    Contractions: Irregular. Vag. Bleeding: None.  Movement: Present. Leaking Fluid denies.  ----------------------------------------------------------------------------------- The following portions of the patient's history were reviewed and updated as appropriate: allergies, current medications, past family history, past medical history, past social history, past surgical history and problem list. Problem list updated.  Objective  Blood pressure 122/76, weight 198 lb (89.8 kg), last menstrual period 03/14/2020. Pregravid weight 196 lb (88.9 kg) Total Weight Gain 2 lb (0.907 kg) Urinalysis: Urine Protein    Urine Glucose  , Urine is tea colored today   Fetal Status: Fetal Heart Rate (bpm): 138 Fundal Height: 31 cm Movement: Present     General:  Alert, oriented and cooperative. Patient is in no acute distress.  Skin: Skin is warm and dry. No rash noted.   Cardiovascular: Normal heart rate noted  Respiratory: Normal respiratory effort, no problems with respiration noted  Abdomen: Soft, gravid, appropriate for gestational age. Pain/Pressure: Absent     Pelvic:  Cervical  exam deferred        Extremities: Normal range of motion.  Edema: None  Mental Status: Normal mood and affect. Normal behavior. Normal judgment and thought content.   Assessment   30 y.o. G2P0010 at [redacted]w[redacted]d by  12/19/2020, by Last Menstrual Period presenting for routine prenatal visit  Plan   SECOND Problems (from 04/28/20 to present)     Problem Noted Resolved   Nausea and vomiting during pregnancy 05/26/2020 by Conard Novak, MD No   Supervision of normal pregnancy 05/01/2020 by Mirna Mires, CNM No   Overview Addendum 09/30/2020  9:24 AM by Mirna Mires, CNM     Clinic  Prenatal Labs  Dating  Blood type: AB/Positive/-- (06/24 1457)   Genetic Screen 1 Screen:    AFP:     Quad:     NIPS: Antibody:Negative (06/24 1457)  Anatomic [place]  Rubella: 1.37 (06/24 1457)  GTT Early:               Third trimester: 9/27 RPR: Non Reactive (06/24 1457)   Flu vaccine  HBsAg: Negative (06/24 1457)   TDaP vaccine 10/14/20                                              Rhogam: HIV: Non Reactive (06/24 1457)   Baby Food                                               GBS: (For PCN allergy, check sensitivities)  Contraception  IFO:YDXA  Circumcision    Pediatrician    Support Person                   Preterm labor symptoms and general obstetric precautions including but not limited to vaginal bleeding, contractions, leaking of fluid and fetal movement were reviewed in detail with the patient. Please refer to After Visit Summary for other counseling recommendations.   Return in about 2 weeks (around 10/28/2020) for rob.  Tresea Mall, CNM 10/14/2020 4:04 PM

## 2020-10-17 ENCOUNTER — Other Ambulatory Visit: Payer: BC Managed Care – PPO

## 2020-10-27 ENCOUNTER — Ambulatory Visit: Payer: BC Managed Care – PPO | Admitting: Cardiology

## 2020-10-28 ENCOUNTER — Ambulatory Visit (INDEPENDENT_AMBULATORY_CARE_PROVIDER_SITE_OTHER): Payer: BC Managed Care – PPO | Admitting: Obstetrics

## 2020-10-28 ENCOUNTER — Other Ambulatory Visit: Payer: Self-pay

## 2020-10-28 VITALS — BP 122/72 | Ht 64.0 in | Wt 198.0 lb

## 2020-10-28 DIAGNOSIS — Z3A32 32 weeks gestation of pregnancy: Secondary | ICD-10-CM

## 2020-10-28 DIAGNOSIS — R300 Dysuria: Secondary | ICD-10-CM

## 2020-10-28 DIAGNOSIS — Z3483 Encounter for supervision of other normal pregnancy, third trimester: Secondary | ICD-10-CM

## 2020-10-28 LAB — POCT URINALYSIS DIPSTICK OB: Glucose, UA: NEGATIVE

## 2020-10-28 LAB — POCT URINALYSIS DIPSTICK
Glucose, UA: NEGATIVE
Nitrite, UA: NEGATIVE
Protein, UA: POSITIVE — AB
Spec Grav, UA: >=1.03 — AB (ref 1.010–1.025)
Urobilinogen, UA: 0.2 E.U./dL
pH, UA: 5 (ref 5.0–8.0)

## 2020-10-28 NOTE — Progress Notes (Signed)
Routine Prenatal Care Visit  Subjective  Mary Diaz is a 30 y.o. G2P0010 at [redacted]w[redacted]d being seen today for ongoing prenatal care.  She is currently monitored for the following issues for this low-risk pregnancy and has Menorrhagia with irregular cycle; Iron deficiency anemia due to chronic blood loss; Endometriosis determined by laparoscopy; Supervision of normal pregnancy; and Nausea and vomiting during pregnancy on their problem list.  ----------------------------------------------------------------------------------- Patient reports she has become nauseous again, and has had to take Zofran. She feels very tired and her mouth feels dry often..   Contractions: Irritability. Vag. Bleeding: None.  Movement: Present. Leaking Fluid denies.  ----------------------------------------------------------------------------------- The following portions of the patient's history were reviewed and updated as appropriate: allergies, current medications, past family history, past medical history, past social history, past surgical history and problem list. Problem list updated.  Objective  Blood pressure 122/72, height 5\' 4"  (1.626 m), weight 198 lb (89.8 kg), last menstrual period 03/14/2020. Pregravid weight 196 lb (88.9 kg) Total Weight Gain 2 lb (0.907 kg) Urinalysis: Urine Protein    Urine Glucose    Fetal Status:     Movement: Present     General:  Alert, oriented and cooperative. Patient is in no acute distress.  Skin: Skin is warm and dry. No rash noted.   Cardiovascular: Normal heart rate noted  Respiratory: Normal respiratory effort, no problems with respiration noted  Abdomen: Soft, gravid, appropriate for gestational age. Pain/Pressure: Present     Pelvic:  Cervical exam deferred        Extremities: Normal range of motion.     Mental Status: Normal mood and affect. Normal behavior. Normal judgment and thought content.   Assessment   30 y.o. G2P0010 at [redacted]w[redacted]d by  12/19/2020, by Last  Menstrual Period presenting for routine prenatal visit  Plan   SECOND Problems (from 04/28/20 to present)    Problem Noted Resolved   Nausea and vomiting during pregnancy 05/26/2020 by 05/28/2020, MD No   Supervision of normal pregnancy 05/01/2020 by 05/03/2020, CNM No   Overview Addendum 10/28/2020  4:12 PM by 10/30/2020, CNM     Clinic  Prenatal Labs  Dating done Blood type: AB/Positive/-- (06/24 1457)   Genetic Screen 1 Screen:    AFP:     Quad:     NIPS: Antibody:Negative (06/24 1457)  Anatomic 12-[birth year]1986 Normal female Rubella: 1.37 (06/24 1457)  GTT Early:               Third trimester: 9/27 85 RPR: Non Reactive (06/24 1457)   Flu vaccine  HBsAg: Negative (06/24 1457)   TDaP vaccine   10/14/2020                                             Rhogam: HIV: Non Reactive (06/24 1457)   Baby Food                                               GBS: (For PCN allergy, check sensitivities)  Contraception  12-12-1986  Circumcision    Pediatrician    Support Person                  Preterm labor symptoms and general obstetric precautions  including but not limited to vaginal bleeding, contractions, leaking of fluid and fetal movement were reviewed in detail with the patient. Please refer to After Visit Summary for other counseling recommendations.  Her urine is tested today and shows large ketones and trace protein. The urine is tea colored. She is dehydrated, and is sent home to rest, drink Gatorade, water, ginger ale and take Zofran for her nausea. Return in about 2 weeks (around 11/11/2020) for return OB.  Mirna Mires, CNM  10/28/2020 4:12 PM

## 2020-10-29 ENCOUNTER — Observation Stay
Admission: EM | Admit: 2020-10-29 | Discharge: 2020-10-29 | Disposition: A | Discharge status: Home / Self Care | Payer: BC Managed Care – PPO | Attending: Obstetrics and Gynecology | Admitting: Obstetrics and Gynecology

## 2020-10-29 ENCOUNTER — Encounter: Payer: Self-pay | Admitting: Obstetrics and Gynecology

## 2020-10-29 ENCOUNTER — Other Ambulatory Visit: Payer: Self-pay

## 2020-10-29 DIAGNOSIS — R110 Nausea: Secondary | ICD-10-CM | POA: Diagnosis not present

## 2020-10-29 DIAGNOSIS — O219 Vomiting of pregnancy, unspecified: Principal | ICD-10-CM

## 2020-10-29 DIAGNOSIS — O26893 Other specified pregnancy related conditions, third trimester: Secondary | ICD-10-CM

## 2020-10-29 DIAGNOSIS — Z3A32 32 weeks gestation of pregnancy: Secondary | ICD-10-CM | POA: Insufficient documentation

## 2020-10-29 DIAGNOSIS — Z3482 Encounter for supervision of other normal pregnancy, second trimester: Secondary | ICD-10-CM

## 2020-10-29 LAB — COMPREHENSIVE METABOLIC PANEL
ALT: 19 U/L (ref 0–44)
AST: 18 U/L (ref 15–41)
Albumin: 3 g/dL — ABNORMAL LOW (ref 3.5–5.0)
Alkaline Phosphatase: 108 U/L (ref 38–126)
Anion gap: 6 (ref 5–15)
BUN: 5 mg/dL — ABNORMAL LOW (ref 6–20)
CO2: 23 mmol/L (ref 22–32)
Calcium: 9 mg/dL (ref 8.9–10.3)
Chloride: 107 mmol/L (ref 98–111)
Creatinine, Ser: 0.44 mg/dL (ref 0.44–1.00)
GFR, Estimated: >60 mL/min (ref >60)
Glucose, Bld: 94 mg/dL (ref 70–99)
Potassium: 3.9 mmol/L (ref 3.5–5.1)
Sodium: 136 mmol/L (ref 135–145)
Total Bilirubin: 0.6 mg/dL (ref 0.3–1.2)
Total Protein: 6.3 g/dL — ABNORMAL LOW (ref 6.5–8.1)

## 2020-10-29 LAB — URINALYSIS, ROUTINE W REFLEX MICROSCOPIC
Bilirubin Urine: NEGATIVE
Glucose, UA: NEGATIVE mg/dL
Hgb urine dipstick: NEGATIVE
Ketones, ur: NEGATIVE mg/dL
Nitrite: NEGATIVE
Protein, ur: NEGATIVE mg/dL
Specific Gravity, Urine: 1.006 (ref 1.005–1.030)
pH: 7 (ref 5.0–8.0)

## 2020-10-29 LAB — CBC
HCT: 32.5 % — ABNORMAL LOW (ref 36.0–46.0)
Hemoglobin: 11.2 g/dL — ABNORMAL LOW (ref 12.0–15.0)
MCH: 30.2 pg (ref 26.0–34.0)
MCHC: 34.5 g/dL (ref 30.0–36.0)
MCV: 87.6 fL (ref 80.0–100.0)
Platelets: 220 10*3/uL (ref 150–400)
RBC: 3.71 MIL/uL — ABNORMAL LOW (ref 3.87–5.11)
RDW: 13.6 % (ref 11.5–15.5)
WBC: 8 10*3/uL (ref 4.0–10.5)
nRBC: 0 % (ref 0.0–0.2)

## 2020-10-29 MED ORDER — LACTATED RINGERS IV SOLN: INTRAVENOUS | Status: DC

## 2020-10-29 MED ORDER — ONDANSETRON HCL 4 MG/2ML IJ SOLN
4.0000 mg | Freq: Four times a day (QID) | INTRAMUSCULAR | Status: DC | PRN
  Administered 2020-10-29: 4 mg via INTRAVENOUS
  Filled 2020-10-29: qty 2

## 2020-10-29 NOTE — Discharge Summary (Addendum)
Please see Final Progress Note  Mirna Mires, CNM  10/29/2020 3:33 PM

## 2020-10-29 NOTE — Final Progress Note (Signed)
Final Progress Note  Patient ID: Mary Diaz MRN: [id number] DOB/AGE: November 15, 1990 30 y.o.  Admit date: 10/29/2020 Admitting provider: Mirna Mires, CNM Discharge date: 10/29/2020   Admission Diagnoses: Nausea and vomiting in pregnancy  Discharge Diagnoses:  Active Problems:   Nausea and vomiting in pregnancy  Reactive fetal heart tones  History of Present Illness: The patient is a 30 y.o. female G2P0010 at [redacted]w[redacted]d who presents for evaluation of nausea and vomiting . She was seen yesterday in the office and at htat time, reported that she has ongoing N and V, and had been feeling very tired and "dry". Her urine in the office was concentrated, and showed large amounts of ketones. She was instructed to rehydrate with oral fluid, using Gatorade, Pedialyte and juices. Today, she reached out to the [facility] office reporting that she had had more episodes of vomiting, and was feeling again weak. She reported to labor and Delivery for evaluation..   Past Medical History:  Diagnosis Date   Anemia    Endometriosis    Ovarian cyst     Past Surgical History:  Procedure Laterality Date   LAPAROSCOPIC OVARIAN CYSTECTOMY Right 07/04/2018   Procedure: RIGHT LAPAROSCOPIC OVARIAN CYSTECTOMY with peritoneal biopsies;  Surgeon: Nadara Mustard, MD;  Location: [facility] ORS;  Service: Gynecology;  Laterality: Right;    No current facility-administered medications on file prior to encounter.   Current Outpatient Medications on File Prior to Encounter  Medication Sig Dispense Refill   ferrous sulfate 325 (65 FE) MG EC tablet Take 325 mg by mouth daily with breakfast.     fluticasone (FLONASE) 50 MCG/ACT nasal spray Place 1 spray into both nostrils daily as needed for allergies or rhinitis.     ondansetron (ZOFRAN ODT) 4 MG disintegrating tablet Take 1 tablet (4 mg total) by mouth every 8 (eight) hours as needed for nausea. 90 tablet 2   Prenatal Vit-Fe Fumarate-FA (MULTIVITAMIN-PRENATAL) 27-0.8 MG TABS  tablet Take 1 tablet by mouth daily at 12 noon.      Allergies  Allergen Reactions   Percocet [Oxycodone-Acetaminophen] Nausea And Vomiting    Social History   Socioeconomic History   Marital status: Married    Spouse name: Not on file   Number of children: Not on file   Years of education: Not on file   Highest education level: Not on file  Occupational History   Not on file  Tobacco Use   Smoking status: Never   Smokeless tobacco: Never  Vaping Use   Vaping Use: Never used  Substance and Sexual Activity   Alcohol use: Not Currently   Drug use: Never   Sexual activity: Yes    Birth control/protection: None  Other Topics Concern   Not on file  Social History Narrative   Not on file   Social Determinants of Health   Financial Resource Strain: Not on file  Food Insecurity: Not on file  Transportation Needs: Not on file  Physical Activity: Not on file  Stress: Not on file  Social Connections: Not on file  Intimate Partner Violence: Not on file    Family History  Adopted: Yes     Review of Systems  Constitutional:  Positive for malaise/fatigue.  HENT: Negative.    Eyes: Negative.   Respiratory: Negative.    Cardiovascular: Negative.   Gastrointestinal:  Positive for nausea and vomiting.       Ongoing during this pregnancy.  Genitourinary: Negative.   Musculoskeletal: Negative.   Skin: Negative.  Neurological: Negative.   Endo/Heme/Allergies: Negative.   Psychiatric/Behavioral: Negative.      Physical Exam: BP 111/63   Pulse 79   Temp 98.3 F (36.8 C) (Oral)   Resp 16   Ht 5\' 4"  (1.626 m)   Wt 89.4 kg   LMP 03/14/2020 (Exact Date)   BMI 33.81 kg/m   Physical Exam Constitutional:      Appearance: Normal appearance. She is obese.  Genitourinary:     Genitourinary Comments: Exam deferred  HENT:     Head: Normocephalic and atraumatic.  Cardiovascular:     Rate and Rhythm: Normal rate and regular rhythm.  Pulmonary:     Effort: Pulmonary  effort is normal.     Breath sounds: Normal breath sounds.  Abdominal:     General: Bowel sounds are normal.     Palpations: Abdomen is soft.     Comments: Gravid uterus. Fetal heart tones are 130s, reactive , with accels present and decel absent. No regualr contractions noted. No vomiting during this episode of care.  Musculoskeletal:        General: Normal range of motion.     Cervical back: Normal range of motion and neck supple.  Neurological:     General: No focal deficit present.     Mental Status: She is alert and oriented to person, place, and time.  Skin:    General: Skin is warm and dry.  Psychiatric:        Mood and Affect: Mood normal.        Behavior: Behavior normal.    Consults: None  Significant Findings/ Diagnostic Studies: labs:  Results for orders placed or performed during the hospital encounter of 10/29/20 (from the past 24 hour(s))  Urinalysis, Routine w reflex microscopic     Status: Abnormal   Collection Time: 10/29/20  2:04 PM  Result Value Ref Range   Color, Urine YELLOW (A) YELLOW   APPearance CLEAR (A) CLEAR   Specific Gravity, Urine 1.006 1.005 - 1.030   pH 7.0 5.0 - 8.0   Glucose, UA NEGATIVE NEGATIVE mg/dL   Hgb urine dipstick NEGATIVE NEGATIVE   Bilirubin Urine NEGATIVE NEGATIVE   Ketones, ur NEGATIVE NEGATIVE mg/dL   Protein, ur NEGATIVE NEGATIVE mg/dL   Nitrite NEGATIVE NEGATIVE   Leukocytes,Ua SMALL (A) NEGATIVE   RBC / HPF 0-5 0 - 5 RBC/hpf   WBC, UA 0-5 0 - 5 WBC/hpf   Bacteria, UA RARE (A) NONE SEEN   Squamous Epithelial / LPF 0-5 0 - 5   Mucus PRESENT   CBC     Status: Abnormal   Collection Time: 10/29/20  2:08 PM  Result Value Ref Range   WBC 8.0 4.0 - 10.5 K/uL   RBC 3.71 (L) 3.87 - 5.11 MIL/uL   Hemoglobin 11.2 (L) 12.0 - 15.0 g/dL   HCT 10/31/20 (L) 16.1 - 09.6 %   MCV 87.6 80.0 - 100.0 fL   MCH 30.2 26.0 - 34.0 pg   MCHC 34.5 30.0 - 36.0 g/dL   RDW 04.5 40.9 - 81.1 %   Platelets 220 150 - 400 K/uL   nRBC 0.0 0.0 - 0.2 %   Comprehensive metabolic panel     Status: Abnormal   Collection Time: 10/29/20  2:08 PM  Result Value Ref Range   Sodium 136 135 - 145 mmol/L   Potassium 3.9 3.5 - 5.1 mmol/L   Chloride 107 98 - 111 mmol/L   CO2 23 22 - 32 mmol/L  Glucose, Bld 94 70 - 99 mg/dL   BUN 5 (L) 6 - 20 mg/dL   Creatinine, Ser 5.09 0.44 - 1.00 mg/dL   Calcium 9.0 8.9 - 32.6 mg/dL   Total Protein 6.3 (L) 6.5 - 8.1 g/dL   Albumin 3.0 (L) 3.5 - 5.0 g/dL   AST 18 15 - 41 U/L   ALT 19 0 - 44 U/L   Alkaline Phosphatase 108 38 - 126 U/L   Total Bilirubin 0.6 0.3 - 1.2 mg/dL   GFR, Estimated >71 >24 mL/min   Anion gap 6 5 - 15     Procedures: EFM NST Baseline FHR: 130 baseline beats/min Variability: moderate Accelerations: present Decelerations: absent Tocometry: non contraction pattern noted.  Interpretation:  INDICATIONS: ongoing nausea and vomiting RESULTS:  A NST procedure was performed with FHR monitoring and a normal baseline established, appropriate time of 20-40 minutes of evaluation, and accels >2 seen w 15x15 characteristics.  Results show a REACTIVE NST.    Hospital Course: The patient was admitted to Labor and Delivery Triage for observation. She was placed on the fetal monitor and a Category 1 tracing was noted. A panel of labs was ordered, and her urine sample indicated she was amply hydrated. She was provided an IV of LR and given a bolus of fluid along with a dose of zofran.  With a reactive FHT tracing, and labs that indicate she is not dehydrated. The patient was discharged home reassured, with plans for follow up at her next [facility] appointment. She will continue to use her prescribed po Zofran to address any vomiting.  Discharge Condition: good  Disposition: Discharge disposition: 01-Home or Self Care      Diet: Regular diet  Discharge Activity: Activity as tolerated  Discharge Instructions     Notify physician for a general feeling that "something is not right"   Complete by: As  directed    Notify physician for increase or change in vaginal discharge   Complete by: As directed    Notify physician for intestinal cramps, with or without diarrhea, sometimes described as "gas pain"   Complete by: As directed    Notify physician for leaking of fluid   Complete by: As directed    Notify physician for low, dull backache, unrelieved by heat or Tylenol   Complete by: As directed    Notify physician for menstrual like cramps   Complete by: As directed    Notify physician for pelvic pressure   Complete by: As directed    Notify physician for uterine contractions.  These may be painless and feel like the uterus is tightening or the baby is  "balling up"   Complete by: As directed    Notify physician for vaginal bleeding   Complete by: As directed    PRETERM LABOR:  Includes any of the follwing symptoms that occur between 20 - [redacted] weeks gestation.  If these symptoms are not stopped, preterm labor can result in preterm delivery, placing your baby at risk   Complete by: As directed       Allergies as of 10/29/2020       Reactions   Percocet [oxycodone-acetaminophen] Nausea And Vomiting        Medication List     TAKE these medications    ferrous sulfate 325 (65 FE) MG EC tablet Take 325 mg by mouth daily with breakfast.   fluticasone 50 MCG/ACT nasal spray Commonly known as: FLONASE Place 1 spray into both nostrils daily as  needed for allergies or rhinitis.   multivitamin-prenatal 27-0.8 MG Tabs tablet Take 1 tablet by mouth daily at 12 noon.   ondansetron 4 MG disintegrating tablet Commonly known as: Zofran ODT Take 1 tablet (4 mg total) by mouth every 8 (eight) hours as needed for nausea.         Total time spent taking care of this patient: 45 minutes  Signed: Mirna Mires, CNM  10/29/2020, 3:35 PM

## 2020-10-29 NOTE — OB Triage Note (Signed)
Pt presents for N/V x2 this AM. Feels dehydrated. +FM. Denies LOF/bleeding/ctx. VSS

## 2020-11-10 ENCOUNTER — Other Ambulatory Visit: Payer: Self-pay

## 2020-11-10 ENCOUNTER — Ambulatory Visit (INDEPENDENT_AMBULATORY_CARE_PROVIDER_SITE_OTHER): Payer: BC Managed Care – PPO | Admitting: Advanced Practice Midwife

## 2020-11-10 ENCOUNTER — Encounter: Payer: Self-pay | Admitting: Advanced Practice Midwife

## 2020-11-10 VITALS — BP 120/80 | Wt 198.0 lb

## 2020-11-10 DIAGNOSIS — Z3A34 34 weeks gestation of pregnancy: Secondary | ICD-10-CM

## 2020-11-10 DIAGNOSIS — Z3483 Encounter for supervision of other normal pregnancy, third trimester: Secondary | ICD-10-CM

## 2020-11-10 NOTE — Progress Notes (Signed)
Routine Prenatal Care Visit  Subjective  Mary Diaz is a 30 y.o. G2P0010 at [redacted]w[redacted]d being seen today for ongoing prenatal care.  She is currently monitored for the following issues for this low-risk pregnancy and has Iron deficiency anemia due to chronic blood loss; Endometriosis determined by laparoscopy; Supervision of normal pregnancy; and Nausea and vomiting during pregnancy on their problem list.  ----------------------------------------------------------------------------------- Patient reports some general discomforts related to fetal positioning. Reviewed comfort measures.   Contractions: Not present. Vag. Bleeding: None.  Movement: Present. Leaking Fluid denies.  ----------------------------------------------------------------------------------- The following portions of the patient's history were reviewed and updated as appropriate: allergies, current medications, past family history, past medical history, past social history, past surgical history and problem list. Problem list updated.  Objective  Blood pressure 120/80, weight 198 lb (89.8 kg), last menstrual period 03/14/2020. Pregravid weight 196 lb (88.9 kg) Total Weight Gain 2 lb (0.907 kg) Urinalysis: Urine Protein    Urine Glucose    Fetal Status: Fetal Heart Rate (bpm): 127 Fundal Height: 35 cm Movement: Present     General:  Alert, oriented and cooperative. Patient is in no acute distress.  Skin: Skin is warm and dry. No rash noted.   Cardiovascular: Normal heart rate noted  Respiratory: Normal respiratory effort, no problems with respiration noted  Abdomen: Soft, gravid, appropriate for gestational age. Pain/Pressure: Present     Pelvic:  Cervical exam deferred        Extremities: Normal range of motion.  Edema: None  Mental Status: Normal mood and affect. Normal behavior. Normal judgment and thought content.   Assessment   30 y.o. G2P0010 at [redacted]w[redacted]d by  12/19/2020, by Last Menstrual Period presenting for routine  prenatal visit  Plan   SECOND Problems (from 04/28/20 to present)    Problem Noted Resolved   Nausea and vomiting during pregnancy 05/26/2020 by Conard Novak, MD No   Supervision of normal pregnancy 05/01/2020 by Mirna Mires, CNM No   Overview Addendum 10/28/2020  4:12 PM by Mirna Mires, CNM     Clinic  Prenatal Labs  Dating done Blood type: AB/Positive/-- (06/24 1457)   Genetic Screen 1 Screen:    AFP:     Quad:     NIPS: Antibody:Negative (06/24 1457)  Anatomic [place] Normal female Rubella: 1.37 (06/24 1457)  GTT Early:               Third trimester: 9/27 85 RPR: Non Reactive (06/24 1457)   Flu vaccine  HBsAg: Negative (06/24 1457)   TDaP vaccine   10/14/2020                                             Rhogam: HIV: Non Reactive (06/24 1457)   Baby Food                                               GBS: (For PCN allergy, check sensitivities)  Contraception  BPZ:WCHE  Circumcision    Pediatrician    Support Person                  Preterm labor symptoms and general obstetric precautions including but not limited to vaginal bleeding, contractions, leaking of fluid and fetal movement  were reviewed in detail with the patient. Please refer to After Visit Summary for other counseling recommendations.   Return in about 2 weeks (around 11/24/2020) for rob.  Tresea Mall, CNM 11/10/2020 3:28 PM

## 2020-11-18 ENCOUNTER — Other Ambulatory Visit: Payer: Self-pay

## 2020-11-18 ENCOUNTER — Telehealth: Payer: Self-pay

## 2020-11-18 ENCOUNTER — Encounter: Payer: Self-pay | Admitting: Obstetrics & Gynecology

## 2020-11-18 ENCOUNTER — Other Ambulatory Visit: Payer: Self-pay | Admitting: Obstetrics

## 2020-11-18 ENCOUNTER — Observation Stay
Admission: EM | Admit: 2020-11-18 | Discharge: 2020-11-18 | Disposition: A | Discharge status: Home / Self Care | Payer: BC Managed Care – PPO | Attending: Obstetrics & Gynecology | Admitting: Obstetrics & Gynecology

## 2020-11-18 DIAGNOSIS — Z3A35 35 weeks gestation of pregnancy: Secondary | ICD-10-CM | POA: Diagnosis not present

## 2020-11-18 DIAGNOSIS — Z79899 Other long term (current) drug therapy: Secondary | ICD-10-CM | POA: Insufficient documentation

## 2020-11-18 DIAGNOSIS — O219 Vomiting of pregnancy, unspecified: Secondary | ICD-10-CM | POA: Diagnosis present

## 2020-11-18 DIAGNOSIS — O26893 Other specified pregnancy related conditions, third trimester: Secondary | ICD-10-CM

## 2020-11-18 DIAGNOSIS — R112 Nausea with vomiting, unspecified: Secondary | ICD-10-CM

## 2020-11-18 DIAGNOSIS — O99891 Other specified diseases and conditions complicating pregnancy: Secondary | ICD-10-CM

## 2020-11-18 DIAGNOSIS — Z3689 Encounter for other specified antenatal screening: Secondary | ICD-10-CM

## 2020-11-18 LAB — URINALYSIS, ROUTINE W REFLEX MICROSCOPIC
Bilirubin Urine: NEGATIVE
Glucose, UA: NEGATIVE mg/dL
Hgb urine dipstick: NEGATIVE
Ketones, ur: 5 mg/dL — AB
Nitrite: NEGATIVE
Protein, ur: 30 mg/dL — AB
Specific Gravity, Urine: 1.028 (ref 1.005–1.030)
pH: 5 (ref 5.0–8.0)

## 2020-11-18 LAB — PROTEIN / CREATININE RATIO, URINE
Creatinine, Urine: 231 mg/dL
Protein Creatinine Ratio: 0.11 mg/mg{Cre} (ref 0.00–0.15)
Total Protein, Urine: 25 mg/dL

## 2020-11-18 LAB — COMPREHENSIVE METABOLIC PANEL
ALT: 25 U/L (ref 0–44)
AST: 23 U/L (ref 15–41)
Albumin: 2.8 g/dL — ABNORMAL LOW (ref 3.5–5.0)
Alkaline Phosphatase: 136 U/L — ABNORMAL HIGH (ref 38–126)
Anion gap: 7 (ref 5–15)
BUN: 5 mg/dL — ABNORMAL LOW (ref 6–20)
CO2: 24 mmol/L (ref 22–32)
Calcium: 8.6 mg/dL — ABNORMAL LOW (ref 8.9–10.3)
Chloride: 104 mmol/L (ref 98–111)
Creatinine, Ser: 0.5 mg/dL (ref 0.44–1.00)
GFR, Estimated: >60 mL/min (ref >60)
Glucose, Bld: 108 mg/dL — ABNORMAL HIGH (ref 70–99)
Potassium: 3.8 mmol/L (ref 3.5–5.1)
Sodium: 135 mmol/L (ref 135–145)
Total Bilirubin: 0.3 mg/dL (ref 0.3–1.2)
Total Protein: 6 g/dL — ABNORMAL LOW (ref 6.5–8.1)

## 2020-11-18 LAB — GLUCOSE, CAPILLARY: Glucose-Capillary: 112 mg/dL — ABNORMAL HIGH (ref 70–99)

## 2020-11-18 MED ORDER — ONDANSETRON HCL 4 MG/2ML IJ SOLN: 4.0000 mg | Freq: Four times a day (QID) | INTRAMUSCULAR | Status: DC | PRN

## 2020-11-18 MED ORDER — NITROFURANTOIN MONOHYD MACRO 100 MG PO CAPS: 100.0000 mg | ORAL_CAPSULE | Freq: Two times a day (BID) | ORAL | 1 refills | Status: DC

## 2020-11-18 MED ORDER — ACETAMINOPHEN 325 MG PO TABS
650.0000 mg | ORAL_TABLET | ORAL | Status: DC | PRN
  Administered 2020-11-18: 650 mg via ORAL
  Filled 2020-11-18: qty 2

## 2020-11-18 NOTE — Discharge Summary (Signed)
Please see Final progress note. Mirna Mires, CNM  11/18/2020 12:38 PM

## 2020-11-18 NOTE — Progress Notes (Signed)
Reviewed discharge instructions with patient. Patient verbalizes understanding. All concerns and questions addressed. Patient discharged home with spouse, volunteer took to [facility] entrance in wheelchair.

## 2020-11-18 NOTE — ED Triage Notes (Signed)
Pt c/o dizziness with blurred vision and HA that started about PTA while sitting at her desk.. pt is [redacted] weeks pregnant, pt denies any abd pain or bleeding at this time.Marland Kitchen

## 2020-11-18 NOTE — OB Triage Note (Signed)
Patient is G1P0, [redacted]w[redacted]d presents with complaints of headsche, blurred vision, nausea and dizziness. Patient states that she vomitted up her whole dinner about 0030 and woke up feeling bad. Patient was at work and states that the computer screen was blurry and she started seeing "black spots", patient stood up and a "wave come over" her and she had a headache and felt very dizzy and was hot. The headache has progressively gotten worse and no changes in vision, patient states she is still seeing spots and it is blurry. No edema. No RUQ pain. Reflexes plus 2, clonus absent. No LOF or bleeding. +FM. VSS. Monitors applied and assessing.

## 2020-11-18 NOTE — Final Progress Note (Signed)
Final Progress Note  Patient ID: Mary Diaz MRN: [id number] DOB/AGE: [birth year] 30 y.o.  Admit date: 11/18/2020 Admitting provider: Mirna Mires, CNM Discharge date: 11/18/2020   Admission Diagnoses: Nausea and vomiting in pregnancy in the third trimester Weakness in pregnancy Active Problems:   Nausea and vomiting in pregnancy    History of Present Illness: The patient is a 30 y.o. female G2P0010 at [redacted]w[redacted]d who presents for evaluation of excess nausea and vomiting with spells of dizziness and some visual changes this morning. She has struggled with N and V throughout this pregnancy. This morning early (0400) she threw up her entire dinner from the evening, and she awoke this morning, took Zofran and headed to work. 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 At work she began to feel dizzy, had some visual changes and then developed a headache. She decided to report to [facility] for evaluation. Her baby has been moving well, she denies any contractions, vaginal bleeding or LOF.  Past Medical History:  Diagnosis Date   Anemia    Endometriosis    Ovarian cyst     Past Surgical History:  Procedure Laterality Date   LAPAROSCOPIC OVARIAN CYSTECTOMY Right 07/04/2018   Procedure: RIGHT LAPAROSCOPIC OVARIAN CYSTECTOMY with peritoneal biopsies;  Surgeon: Nadara Mustard, MD;  Location: [facility] ORS;  Service: Gynecology;  Laterality: Right;    No current facility-administered medications on file prior to encounter.   Current Outpatient Medications on File Prior to Encounter  Medication Sig Dispense Refill   ferrous sulfate 325 (65 FE) MG EC tablet Take 325 mg by mouth daily with breakfast.     fluticasone (FLONASE) 50 MCG/ACT nasal spray Place 1 spray into both nostrils daily as needed for allergies or rhinitis.     ondansetron (ZOFRAN ODT) 4 MG disintegrating tablet Take 1 tablet (4 mg total) by mouth every 8 (eight) hours as needed for nausea. 90 tablet 2   Prenatal Vit-Fe  Fumarate-FA (MULTIVITAMIN-PRENATAL) 27-0.8 MG TABS tablet Take 1 tablet by mouth daily at 12 noon.      Allergies  Allergen Reactions   Percocet [Oxycodone-Acetaminophen] Nausea And Vomiting    Social History   Socioeconomic History   Marital status: Married    Spouse name: Phil   Number of children: Not on file   Years of education: Not on file   Highest education level: Not on file  Occupational History   Not on file  Tobacco Use   Smoking status: Never   Smokeless tobacco: Never  Vaping Use   Vaping Use: Never used  Substance and Sexual Activity   Alcohol use: Not Currently   Drug use: Never   Sexual activity: Yes    Birth control/protection: None  Other Topics Concern   Not on file  Social History Narrative   Not on file   Social Determinants of Health   Financial Resource Strain: Not on file  Food Insecurity: Not on file  Transportation Needs: Not on file  Physical Activity: Not on file  Stress: Not on file  Social Connections: Not on file  Intimate Partner Violence: Not on file    Family History  Adopted: Yes     Review of Systems  Constitutional:  Positive for malaise/fatigue.  HENT: Negative.    Eyes: Negative.   Respiratory: Negative.    Cardiovascular: Negative.   Gastrointestinal:  Positive for nausea and vomiting.  Genitourinary: Negative.   Musculoskeletal: Negative.   Skin: Negative.   Neurological:  Positive for dizziness.  Endo/Heme/Allergies: Negative.   Psychiatric/Behavioral: Negative.      Physical Exam: BP 115/66 (BP Location: Right Arm)   Pulse (!) 101   Temp 98.1 F (36.7 C) (Oral)   Resp 18   Ht 5\' 4"  (1.626 m)   Wt 89.8 kg   LMP 03/14/2020 (Exact Date)   BMI 33.99 kg/m   OBGyn Exam  Consults: None  Significant Findings/ Diagnostic Studies: labs:  Results for orders placed or performed during the hospital encounter of 11/18/20 (from the past 24 hour(s))  Protein / creatinine ratio, urine     Status: None    Collection Time: 11/18/20 10:42 AM  Result Value Ref Range   Creatinine, Urine 231 mg/dL   Total Protein, Urine 25 mg/dL   Protein Creatinine Ratio 0.11 0.00 - 0.15 mg/mg[Cre]  Urinalysis, Routine w reflex microscopic Urine, Clean Catch     Status: Abnormal   Collection Time: 11/18/20 10:42 AM  Result Value Ref Range   Color, Urine AMBER (A) YELLOW   APPearance HAZY (A) CLEAR   Specific Gravity, Urine 1.028 1.005 - 1.030   pH 5.0 5.0 - 8.0   Glucose, UA NEGATIVE NEGATIVE mg/dL   Hgb urine dipstick NEGATIVE NEGATIVE   Bilirubin Urine NEGATIVE NEGATIVE   Ketones, ur 5 (A) NEGATIVE mg/dL   Protein, ur 30 (A) NEGATIVE mg/dL   Nitrite NEGATIVE NEGATIVE   Leukocytes,Ua MODERATE (A) NEGATIVE   RBC / HPF 0-5 0 - 5 RBC/hpf   WBC, UA 21-50 0 - 5 WBC/hpf   Bacteria, UA MANY (A) NONE SEEN   Squamous Epithelial / LPF 0-5 0 - 5   Mucus PRESENT   Glucose, capillary     Status: Abnormal   Collection Time: 11/18/20 10:57 AM  Result Value Ref Range   Glucose-Capillary 112 (H) 70 - 99 mg/dL  Comprehensive metabolic panel     Status: Abnormal   Collection Time: 11/18/20 11:05 AM  Result Value Ref Range   Sodium 135 135 - 145 mmol/L   Potassium 3.8 3.5 - 5.1 mmol/L   Chloride 104 98 - 111 mmol/L   CO2 24 22 - 32 mmol/L   Glucose, Bld 108 (H) 70 - 99 mg/dL   BUN 5 (L) 6 - 20 mg/dL   Creatinine, Ser 0.27 0.44 - 1.00 mg/dL   Calcium 8.6 (L) 8.9 - 10.3 mg/dL   Total Protein 6.0 (L) 6.5 - 8.1 g/dL   Albumin 2.8 (L) 3.5 - 5.0 g/dL   AST 23 15 - 41 U/L   ALT 25 0 - 44 U/L   Alkaline Phosphatase 136 (H) 38 - 126 U/L   Total Bilirubin 0.3 0.3 - 1.2 mg/dL   GFR, Estimated >74 >12 mL/min   Anion gap 7 5 - 15     Procedures: EFM NST Baseline FHR: 145 beats/min Variability: moderate Accelerations: present Decelerations: absent Tocometry: no contractions noted or palpated  Interpretation:  INDICATIONS: rule out uterine contractions RESULTS:  A NST procedure was performed with FHR monitoring  and a normal baseline established, appropriate time of 20-40 minutes of evaluation, and accels >2 seen w 15x15 characteristics.  Results show a REACTIVE NST.    Hospital Course: The patient was admitted to Labor and Delivery Triage for observation. She wasmonitored for contractions and to evaluate fetal status. Her NST was reactive. A urine sample showed + Leukkocytes and evidence of bacteria; a culture is pending. She was provided tylenol for her headache, was rehydrated with oral juice, water  and other fluids, and was also given some snacks. Additional labs were ordered as her urine was concentrated and to determine the need for IV fluid hydration. As her UA indicates a possible UTI, ahd is started on Macrobid, while a culture is pending. With a reassuring FHT strip, no vomiting during her stay, and imporvement in her headache post Tylenol, she is discharged home to continue hydrating and with plans for follow up at [facility].  Discharge Condition: good  Disposition: Discharge disposition: 01-Home or Self Care      Diet: Regular diet  Discharge Activity: Activity as tolerated  Discharge Instructions     Fetal Kick Count:  Lie on our left side for one hour after a meal, and count the number of times your baby kicks.  If it is less than 5 times, get up, move around and drink some juice.  Repeat the test 30 minutes later.  If it is still less than 5 kicks in an hour, notify your doctor.   Complete by: As directed    LABOR:  When conractions begin, you should start to time them from the beginning of one contraction to the beginning  of the next.  When contractions are 5 - 10 minutes apart or less and have been regular for at least an hour, you should call your health care provider.   Complete by: As directed    Notify physician for bleeding from the vagina   Complete by: As directed    Notify physician for blurring of vision or spots before the eyes   Complete by: As directed    Notify  physician for chills or fever   Complete by: As directed    Notify physician for fainting spells, "black outs" or loss of consciousness   Complete by: As directed    Notify physician for increase in vaginal discharge   Complete by: As directed    Notify physician for leaking of fluid   Complete by: As directed    Notify physician for pain or burning when urinating   Complete by: As directed    Notify physician for pelvic pressure (sudden increase)   Complete by: As directed    Notify physician for severe or continued nausea or vomiting   Complete by: As directed    Notify physician for sudden gushing of fluid from the vagina (with or without continued leaking)   Complete by: As directed    Notify physician for sudden, constant, or occasional abdominal pain   Complete by: As directed    Notify physician if baby moving less than usual   Complete by: As directed       Allergies as of 11/18/2020       Reactions   Percocet [oxycodone-acetaminophen] Nausea And Vomiting        Medication List     TAKE these medications    ferrous sulfate 325 (65 FE) MG EC tablet Take 325 mg by mouth daily with breakfast.   fluticasone 50 MCG/ACT nasal spray Commonly known as: FLONASE Place 1 spray into both nostrils daily as needed for allergies or rhinitis.   multivitamin-prenatal 27-0.8 MG Tabs tablet Take 1 tablet by mouth daily at 12 noon.   nitrofurantoin (macrocrystal-monohydrate) 100 MG capsule Commonly known as: MACROBID Take 1 capsule (100 mg total) by mouth 2 (two) times daily.   ondansetron 4 MG disintegrating tablet Commonly known as: Zofran ODT Take 1 tablet (4 mg total) by mouth every 8 (eight) hours as needed for  nausea.         Total time spent taking care of this patient: 45 minutes  Signed: Mirna Mires, CNM  11/18/2020, 12:50 PM

## 2020-11-19 ENCOUNTER — Encounter: Payer: Self-pay | Admitting: Obstetrics

## 2020-11-19 LAB — URINE CULTURE: Culture: NO GROWTH

## 2020-11-25 ENCOUNTER — Other Ambulatory Visit: Payer: Self-pay

## 2020-11-25 ENCOUNTER — Ambulatory Visit (INDEPENDENT_AMBULATORY_CARE_PROVIDER_SITE_OTHER): Payer: BC Managed Care – PPO | Admitting: Obstetrics

## 2020-11-25 ENCOUNTER — Encounter: Payer: Self-pay | Admitting: Obstetrics

## 2020-11-25 VITALS — BP 120/70 | Wt 201.0 lb

## 2020-11-25 DIAGNOSIS — Z3A36 36 weeks gestation of pregnancy: Secondary | ICD-10-CM

## 2020-11-25 DIAGNOSIS — N898 Other specified noninflammatory disorders of vagina: Secondary | ICD-10-CM | POA: Diagnosis not present

## 2020-11-25 LAB — POCT WET PREP (WET MOUNT): Trichomonas Wet Prep HPF POC: ABSENT

## 2020-11-25 LAB — POCT URINALYSIS DIPSTICK OB
Glucose, UA: NEGATIVE
POC,PROTEIN,UA: NEGATIVE

## 2020-11-25 NOTE — Progress Notes (Signed)
Routine Prenatal Care Visit  Subjective  Mary Diaz is a 30 y.o. G2P0010 at [redacted]w[redacted]d being seen today for ongoing prenatal care.  She is currently monitored for the following issues for this low-risk pregnancy and has Iron deficiency anemia due to chronic blood loss; Endometriosis determined by laparoscopy; Supervision of normal pregnancy; Nausea and vomiting during pregnancy; Nausea and vomiting in pregnancy; and Non-stress test reactive on fetal surveillance on their problem list.  ----------------------------------------------------------------------------------- Pt reports increased discharge, a few days ago it soaked her underwear a little, occasionally she wears a panty liner, denies odor or irritation, had painful IC the other day. Denies VB ----------------------------------------------------------------------------------- The following portions of the patient's history were reviewed and updated as appropriate: allergies, current medications, past family history, past medical history, past social history, past surgical history and problem list. Problem list updated.  Objective  Blood pressure 120/70, weight 201 lb (91.2 kg), last menstrual period 03/14/2020. Pregravid weight 196 lb (88.9 kg) Total Weight Gain 5 lb (2.268 kg) Urinalysis: Urine Protein    Urine Glucose      General:  Alert, oriented and cooperative. Patient is in no acute distress.  Skin: Skin is warm and dry. No rash noted.   Cardiovascular: Normal heart rate noted  Respiratory: Normal respiratory effort, no problems with respiration noted  Abdomen: Soft, gravid, appropriate for gestational age.       Pelvic:  Cervical exam performed FT/50/-3, soft , spec exam, cervix pink, neg pooling   Extremities:  Normal range of motion.     Mental Status: Normal mood and affect. Normal behavior. Normal judgment and thought content.   Microscopic wet-mount exam shows neg clue, trich, or hyphea. Neg ferning and Nitrazine.    Assessment   30 y.o. G2P0010 at [redacted]w[redacted]d by  12/19/2020, by Last Menstrual Period presenting for routine prenatal visit  Plan   SECOND Problems (from 04/28/20 to present)    Problem Noted Resolved   Nausea and vomiting during pregnancy 05/26/2020 by Conard Novak, MD No   Supervision of normal pregnancy 05/01/2020 by Mirna Mires, CNM No   Overview Addendum 10/28/2020  4:12 PM by Mirna Mires, CNM     Clinic  Prenatal Labs  Dating done Blood type: AB/Positive/-- (06/24 1457)   Genetic Screen 1 Screen:    AFP:     Quad:     NIPS: Antibody:Negative (06/24 1457)  Anatomic [place] Normal female Rubella: 1.37 (06/24 1457)  GTT Early:               Third trimester: 9/27 85 RPR: Non Reactive (06/24 1457)   Flu vaccine  HBsAg: Negative (06/24 1457)   TDaP vaccine   10/14/2020                                             Rhogam: HIV: Non Reactive (06/24 1457)   Baby Food                                               GBS: (For PCN allergy, check sensitivities)  Contraception  ZOX:WRUE  Circumcision    Pediatrician    Support Person  Term labor symptoms and general obstetric precautions including but not limited to vaginal bleeding, contractions, leaking of fluid and fetal movement were reviewed in detail with the patient. Please refer to After Visit Summary for other counseling recommendations.  36 wk labs today  Return in 1 week (on 12/02/2020) for ROB.  Carie Caddy, CNM  Domingo Pulse, [facility]  11/25/20  3:19 PM

## 2020-11-25 NOTE — Addendum Note (Signed)
Addended by: Carie Caddy on: 11/25/2020 03:31 PM   Modules accepted: Orders

## 2020-11-25 NOTE — Addendum Note (Signed)
Addended by: Donnetta Hail on: 11/25/2020 03:21 PM   Modules accepted: Orders

## 2020-11-25 NOTE — Progress Notes (Signed)
ROB- GBS with cervix check, no concerns

## 2020-11-29 LAB — CULTURE, BETA STREP (GROUP B ONLY): Strep Gp B Culture: NEGATIVE

## 2020-12-05 ENCOUNTER — Ambulatory Visit (INDEPENDENT_AMBULATORY_CARE_PROVIDER_SITE_OTHER): Payer: BC Managed Care – PPO | Admitting: Obstetrics

## 2020-12-05 ENCOUNTER — Other Ambulatory Visit: Payer: Self-pay

## 2020-12-05 VITALS — BP 120/80 | Wt 202.0 lb

## 2020-12-05 DIAGNOSIS — Z3483 Encounter for supervision of other normal pregnancy, third trimester: Secondary | ICD-10-CM

## 2020-12-05 DIAGNOSIS — Z3A38 38 weeks gestation of pregnancy: Secondary | ICD-10-CM

## 2020-12-05 LAB — POCT URINALYSIS DIPSTICK OB
Glucose, UA: NEGATIVE
POC,PROTEIN,UA: NEGATIVE

## 2020-12-05 NOTE — Progress Notes (Signed)
Routine Prenatal Care Visit  Subjective  Mary Diaz is a 30 y.o. G2P0010 at [redacted]w[redacted]d being seen today for ongoing prenatal care.  She is currently monitored for the following issues for this high-risk pregnancy and has Iron deficiency anemia due to chronic blood loss; Endometriosis determined by laparoscopy; Supervision of normal pregnancy; Nausea and vomiting during pregnancy; Nausea and vomiting in pregnancy; and Non-stress test reactive on fetal surveillance on their problem list.  ----------------------------------------------------------------------------------- Patient reports no complaints.    .  .   Mary Diaz Fluid denies.  ----------------------------------------------------------------------------------- The following portions of the patient's history were reviewed and updated as appropriate: allergies, current medications, past family history, past medical history, past social history, past surgical history and problem list. Problem list updated.  Objective  Blood pressure 120/80, weight 202 lb (91.6 kg), last menstrual period 03/14/2020. Pregravid weight 196 lb (88.9 kg) Total Weight Gain 6 lb (2.722 kg) Urinalysis: Urine Protein Negative  Urine Glucose Negative  Fetal Status:           General:  Alert, oriented and cooperative. Patient is in no acute distress.  Skin: Skin is warm and dry. No rash noted.   Cardiovascular: Normal heart rate noted  Respiratory: Normal respiratory effort, no problems with respiration noted  Abdomen: Soft, gravid, appropriate for gestational age.       Pelvic:  Cervical exam performed      1.5cms/70%/-2. Softening, posterior  Extremities: Normal range of motion.     Mental Status: Normal mood and affect. Normal behavior. Normal judgment and thought content.   Assessment   30 y.o. G2P0010 at [redacted]w[redacted]d by  12/19/2020, by Last Menstrual Period presenting for routine prenatal visit  Plan   SECOND Problems (from 04/28/20 to present)    Problem  Noted Resolved   Nausea and vomiting during pregnancy 05/26/2020 by Conard Novak, MD No   Supervision of normal pregnancy 05/01/2020 by Mirna Mires, CNM No   Overview Addendum 10/28/2020  4:12 PM by Mirna Mires, CNM     Clinic  Prenatal Labs  Dating done Blood type: AB/Positive/-- (06/24 1457)   Genetic Screen 1 Screen:    AFP:     Quad:     NIPS: Antibody:Negative (06/24 1457)  Anatomic [place] Normal female Rubella: 1.37 (06/24 1457)  GTT Early:               Third trimester: 9/27 85 RPR: Non Reactive (06/24 1457)   Flu vaccine  HBsAg: Negative (06/24 1457)   TDaP vaccine   10/14/2020                                             Rhogam: HIV: Non Reactive (06/24 1457)   Baby Food                                               GBS: (For PCN allergy, check sensitivities)  Contraception  HQI:ONGE  Circumcision    Pediatrician    Support Person                  Term labor symptoms and general obstetric precautions including but not limited to vaginal bleeding, contractions, leaking of fluid and fetal movement were reviewed in detail  with the patient. Please refer to After Visit Summary for other counseling recommendations.   Return in about 1 week (around 12/12/2020) for return OB.  Mary Diaz, CNM  12/05/2020 3:20 PM

## 2020-12-05 NOTE — Progress Notes (Signed)
ROB- cervix check 

## 2020-12-08 ENCOUNTER — Observation Stay
Admission: EM | Admit: 2020-12-08 | Discharge: 2020-12-09 | Disposition: A | Discharge status: Home / Self Care | Payer: BC Managed Care – PPO | Attending: Licensed Practical Nurse | Admitting: Licensed Practical Nurse

## 2020-12-08 ENCOUNTER — Encounter: Payer: Self-pay | Admitting: Obstetrics

## 2020-12-08 ENCOUNTER — Other Ambulatory Visit: Payer: Self-pay

## 2020-12-08 ENCOUNTER — Encounter: Payer: Self-pay | Admitting: Obstetrics and Gynecology

## 2020-12-08 DIAGNOSIS — M549 Dorsalgia, unspecified: Secondary | ICD-10-CM | POA: Insufficient documentation

## 2020-12-08 DIAGNOSIS — O0001 Abdominal pregnancy with intrauterine pregnancy: Secondary | ICD-10-CM | POA: Insufficient documentation

## 2020-12-08 DIAGNOSIS — Z3A38 38 weeks gestation of pregnancy: Secondary | ICD-10-CM | POA: Insufficient documentation

## 2020-12-08 DIAGNOSIS — O26893 Other specified pregnancy related conditions, third trimester: Secondary | ICD-10-CM | POA: Insufficient documentation

## 2020-12-08 DIAGNOSIS — O479 False labor, unspecified: Secondary | ICD-10-CM

## 2020-12-08 LAB — RUPTURE OF MEMBRANE (ROM)PLUS: Rom Plus: NEGATIVE

## 2020-12-08 NOTE — OB Triage Note (Signed)
Pt is a G2P0 at [redacted]w[redacted]d presenting to L&D triage complaining of contractions rating them 7/10. Pt also describes losing her "mucous plug" which was a brown/red color yesterday and today. She also endorses soaking a pad but does not know if her water is broken. +FM. VSS. Monitors applied and assessing.

## 2020-12-09 NOTE — OB Triage Note (Signed)
Pt discharged home in stable condition per CNM order. Discharge instructions reviewed and pt verbalizes understanding. Instructions on when to return and pt verbalizes understanding.

## 2020-12-09 NOTE — Discharge Summary (Signed)
Physician Final Progress Note  Patient ID: Mary Diaz MRN: [id number] DOB/AGE: 04-01-1990 29 y.o.  Admit date: 12/08/2020 Admitting provider: Vena Austria, MD Discharge date: 12/09/2020   Admission Diagnoses:  1) intrauterine pregnancy at [redacted]w[redacted]d  2) irregular uterine contractions   Discharge Diagnoses:  Principal Problem:   Uterine contractions  Early labor   History of Present Illness: The patient is a 30 y.o. female G2P0010 at [redacted]w[redacted]d who presents for labor evaluation.   Contractions starting the morning of 12/5, by 7pm they became more regular and by 8:30pm the contractions were every 5 mins, lasting 30 to 60 seconds and becoming more intense. She reports seeing pieces of her mucus plug since Sunday, at one point she passed a lot of mucus with some pink tinge and felt the need to put on  a pad. Since Sunday she has had back and hip pain. On arrival to the unit she was wearing a pad that was dry. Endorsed +FM.  Past Medical History:  Diagnosis Date   Anemia    Endometriosis    Ovarian cyst     Past Surgical History:  Procedure Laterality Date   LAPAROSCOPIC OVARIAN CYSTECTOMY Right 07/04/2018   Procedure: RIGHT LAPAROSCOPIC OVARIAN CYSTECTOMY with peritoneal biopsies;  Surgeon: Nadara Mustard, MD;  Location: [facility] ORS;  Service: Gynecology;  Laterality: Right;    No current facility-administered medications on file prior to encounter.   Current Outpatient Medications on File Prior to Encounter  Medication Sig Dispense Refill   ferrous sulfate 325 (65 FE) MG EC tablet Take 325 mg by mouth daily with breakfast.     ondansetron (ZOFRAN ODT) 4 MG disintegrating tablet Take 1 tablet (4 mg total) by mouth every 8 (eight) hours as needed for nausea. 90 tablet 2   Prenatal Vit-Fe Fumarate-FA (MULTIVITAMIN-PRENATAL) 27-0.8 MG TABS tablet Take 1 tablet by mouth daily at 12 noon.     fluticasone (FLONASE) 50 MCG/ACT nasal spray Place 1 spray into both nostrils daily as needed for  allergies or rhinitis.     nitrofurantoin, macrocrystal-monohydrate, (MACROBID) 100 MG capsule Take 1 capsule (100 mg total) by mouth 2 (two) times daily. (Patient not taking: Reported on 11/25/2020) 14 capsule 1    Allergies  Allergen Reactions   Percocet [Oxycodone-Acetaminophen] Nausea And Vomiting    Social History   Socioeconomic History   Marital status: Married    Spouse name: Phil   Number of children: Not on file   Years of education: Not on file   Highest education level: Not on file  Occupational History   Not on file  Tobacco Use   Smoking status: Never   Smokeless tobacco: Never  Vaping Use   Vaping Use: Never used  Substance and Sexual Activity   Alcohol use: Not Currently   Drug use: Never   Sexual activity: Yes    Birth control/protection: I.U.D., Other-see comments    Comment: undecided  Other Topics Concern   Not on file  Social History Narrative   Not on file   Social Determinants of Health   Financial Resource Strain: Not on file  Food Insecurity: Not on file  Transportation Needs: Not on file  Physical Activity: Not on file  Stress: Not on file  Social Connections: Not on file  Intimate Partner Violence: Not on file    Family History  Adopted: Yes     Review of Systems  Constitutional: Negative.   Eyes: Negative.   Respiratory: Negative.    Cardiovascular: Negative.  Gastrointestinal:  Positive for nausea.       Contraction pain   Genitourinary:        Passing mucus plug   Musculoskeletal:  Positive for back pain.  Skin: Negative.   Neurological: Negative.   Endo/Heme/Allergies: Negative.   Psychiatric/Behavioral: Negative.      Physical Exam: BP 130/85 (BP Location: Left Arm)   Pulse 80   Temp 98.2 F (36.8 C) (Oral)   Resp 16   Ht 5\' 4"  (1.626 m)   Wt 90.7 kg   LMP 03/14/2020 (Exact Date)   BMI 34.33 kg/m   Physical Exam HENT:     Head: Normocephalic.     Mouth/Throat:     Mouth: Mucous membranes are moist.   Pulmonary:     Effort: Pulmonary effort is normal.  Abdominal:     Comments: Gravid, Leopald's ROP,   Musculoskeletal:        General: No swelling.     Cervical back: Normal range of motion.  Neurological:     Mental Status: She is alert and oriented to person, place, and time.  Skin:    General: Skin is warm.  Psychiatric:        Mood and Affect: Mood normal.  FHT: baseline 120, moderate variability, pos accel 15x15, neg decel  Toco: irregular, palpate mild, soft resting tone   Consults: None  Significant Findings/ Diagnostic Studies: ROM negative   Procedures: reactive NST,   Discharge Condition: good  Disposition: Discharge disposition: 01-Home or Self Care       Diet: Regular diet  Discharge Activity: Activity as tolerated   Allergies as of 12/09/2020       Reactions   Percocet [oxycodone-acetaminophen] Nausea And Vomiting        Medication List     TAKE these medications    ferrous sulfate 325 (65 FE) MG EC tablet Take 325 mg by mouth daily with breakfast.   fluticasone 50 MCG/ACT nasal spray Commonly known as: FLONASE Place 1 spray into both nostrils daily as needed for allergies or rhinitis.   multivitamin-prenatal 27-0.8 MG Tabs tablet Take 1 tablet by mouth daily at 12 noon.   nitrofurantoin (macrocrystal-monohydrate) 100 MG capsule Commonly known as: MACROBID Take 1 capsule (100 mg total) by mouth 2 (two) times daily.   ondansetron 4 MG disintegrating tablet Commonly known as: Zofran ODT Take 1 tablet (4 mg total) by mouth every 8 (eight) hours as needed for nausea.           Signed14/06/2020 Jary Louvier, CNM  12/09/2020, 1:03 AM

## 2020-12-09 NOTE — Progress Notes (Signed)
Obstetric H&P   Chief Complaint: contractions since this morning   Prenatal Care Provider: [facility]   History of Present Illness: 30 y.o. G2P0010 [redacted]w[redacted]d by 12/19/2020, by Last Menstrual Period presenting to L&D for labor evaluation. Contractions starting the morning of 12/5, by 7pm they became more regular and by 8:30pm the contractions were every 5 mins, lasting 30 to 60 seconds and becoming more intense. She reports seeing pieces of her mucus plug since Sunday, at one point she passed a lot of mucus with some pink tinge and felt the need to put on  a pad. Since Sunday she has had back and hip pain. On arrival to the unit she was wearing a pad that was dry. Endorsed +FM.   Pregravid weight 88.9 kg Total Weight Gain 1.814 kg  SECOND Problems (from 04/28/20 to present)     Problem Noted Resolved   Uterine contractions 12/08/2020 by Ellwood Sayers, CNM No   Nausea and vomiting during pregnancy 05/26/2020 by Conard Novak, MD No   Supervision of normal pregnancy 05/01/2020 by Mirna Mires, CNM No   Overview Addendum 10/28/2020  4:12 PM by Mirna Mires, CNM     Clinic  Prenatal Labs  Dating done Blood type: AB/Positive/-- (06/24 1457)   Genetic Screen 1 Screen:    AFP:     Quad:     NIPS: Antibody:Negative (06/24 1457)  Anatomic [place] Normal female Rubella: 1.37 (06/24 1457)  GTT Early:               Third trimester: 9/27 85 RPR: Non Reactive (06/24 1457)   Flu vaccine  HBsAg: Negative (06/24 1457)   TDaP vaccine   10/14/2020                                             Rhogam: HIV: Non Reactive (06/24 1457)   Baby Food                                               GBS: (For PCN allergy, check sensitivities)  Contraception  BBC:WUGQ  Circumcision    Pediatrician    Support Person                   Review of Systems: 10 point review of systems negative unless otherwise noted in HPI  Past Medical History: Patient Active Problem List   Diagnosis  Date Noted   Uterine contractions 12/08/2020   Nausea and vomiting in pregnancy 11/18/2020   Non-stress test reactive on fetal surveillance    Nausea and vomiting during pregnancy 05/26/2020   Supervision of normal pregnancy 05/01/2020     Clinic  Prenatal Labs  Dating done Blood type: AB/Positive/-- (06/24 1457)   Genetic Screen 1 Screen:    AFP:     Quad:     NIPS: Antibody:Negative (06/24 1457)  Anatomic [place] Normal female Rubella: 1.37 (06/24 1457)  GTT Early:               Third trimester: 9/27 85 RPR: Non Reactive (06/24 1457)   Flu vaccine  HBsAg: Negative (06/24 1457)   TDaP vaccine   10/14/2020  Rhogam: HIV: Non Reactive (06/24 1457)   Baby Food                                               GBS: (For PCN allergy, check sensitivities)  Contraception  JJO:ACZY  Circumcision    Pediatrician    Support Person            Endometriosis determined by laparoscopy 07/04/2018   Iron deficiency anemia due to chronic blood loss 07/28/2017    Past Surgical History: Past Surgical History:  Procedure Laterality Date   LAPAROSCOPIC OVARIAN CYSTECTOMY Right 07/04/2018   Procedure: RIGHT LAPAROSCOPIC OVARIAN CYSTECTOMY with peritoneal biopsies;  Surgeon: Nadara Mustard, MD;  Location: [facility] ORS;  Service: Gynecology;  Laterality: Right;    Past Obstetric History: # 1 - Date: None, Sex: None, Weight: None, GA: None, Delivery: None, Apgar1: None, Apgar5: None, Living: None, Birth Comments: None  # 2 - Date: None, Sex: None, Weight: None, GA: None, Delivery: None, Apgar1: None, Apgar5: None, Living: None, Birth Comments: None   Past Gynecologic History:  Family History: Family History  Adopted: Yes    Social History: Social History   Socioeconomic History   Marital status: Married    Spouse name: Phil   Number of children: Not on file   Years of education: Not on file   Highest education level: Not on file  Occupational History    Not on file  Tobacco Use   Smoking status: Never   Smokeless tobacco: Never  Vaping Use   Vaping Use: Never used  Substance and Sexual Activity   Alcohol use: Not Currently   Drug use: Never   Sexual activity: Yes    Birth control/protection: I.U.D., Other-see comments    Comment: undecided  Other Topics Concern   Not on file  Social History Narrative   Not on file   Social Determinants of Health   Financial Resource Strain: Not on file  Food Insecurity: Not on file  Transportation Needs: Not on file  Physical Activity: Not on file  Stress: Not on file  Social Connections: Not on file  Intimate Partner Violence: Not on file    Medications: Prior to Admission medications   Medication Sig Start Date End Date Taking? Authorizing Provider  ferrous sulfate 325 (65 FE) MG EC tablet Take 325 mg by mouth daily with breakfast.   Yes [provider]  ondansetron (ZOFRAN ODT) 4 MG disintegrating tablet Take 1 tablet (4 mg total) by mouth every 8 (eight) hours as needed for nausea. 05/26/20  Yes Conard Novak, MD  Prenatal Vit-Fe Fumarate-FA (MULTIVITAMIN-PRENATAL) 27-0.8 MG TABS tablet Take 1 tablet by mouth daily at 12 noon.   Yes [provider]  fluticasone (FLONASE) 50 MCG/ACT nasal spray Place 1 spray into both nostrils daily as needed for allergies or rhinitis.    [provider]  nitrofurantoin, macrocrystal-monohydrate, (MACROBID) 100 MG capsule Take 1 capsule (100 mg total) by mouth 2 (two) times daily. Patient not taking: Reported on 11/25/2020 11/18/20   Mirna Mires, CNM    Allergies: Allergies  Allergen Reactions   Percocet [Oxycodone-Acetaminophen] Nausea And Vomiting    Physical Exam: Vitals: Blood pressure 130/85, pulse 80, temperature 98.2 F (36.8 C), temperature source Oral, resp. rate 16, height 5\' 4"  (1.626 m), weight 90.7 kg, last menstrual period 03/14/2020.  FHT: baseline 120, moderate variability, pos accel 15x15,  neg decel  Toco: irregular, palpate mild, soft resting tone   General: NAD HEENT: normocephalic, anicteric Pulmonary: No increased work of breathing Abdomen: Gravid, non-tender Leopolds: ROP? Extremities: no edema, erythema, or tenderness Neurologic: Grossly intact Psychiatric: mood appropriate, affect full  Labs: Results for orders placed or performed during the hospital encounter of 12/08/20 (from the past 24 hour(s))  ROM Plus ([facility] only)     Status: None   Collection Time: 12/08/20 10:21 PM  Result Value Ref Range   Rom Plus NEGATIVE     Assessment: 30 y.o. G2P0010 [redacted]w[redacted]d by 12/19/2020, by Last Menstrual Period in early versus false labor.   Plan: 1) Disposition - Discharge home, take a bath and Unisom tonight, instructions given for belly binding and miles circuit.   2) Fetus -Reactive NST   3) PNL - Blood type AB/Positive/-- (06/24 1457) / Anti-bodyscreen Negative (06/24 1457) / Rubella 1.37 (06/24 1457) / Varicella Immune / RPR Non Reactive (09/27 1016) / HBsAg Negative (06/24 1457) / HIV Non Reactive (09/27 1016) / 1-hr OGTT 108 / GBS Negative/-- (11/22 1516)  4) Immunization History -  Immunization History  Administered Date(s) Administered   Tdap 10/14/2020      Carie Caddy CNM  [facility], [facility] 12/09/2020, 12:37 AM

## 2020-12-10 ENCOUNTER — Other Ambulatory Visit: Payer: Self-pay

## 2020-12-10 ENCOUNTER — Observation Stay
Admission: EM | Admit: 2020-12-10 | Discharge: 2020-12-10 | Disposition: A | Discharge status: Home / Self Care | Payer: BC Managed Care – PPO | Attending: Advanced Practice Midwife | Admitting: Advanced Practice Midwife

## 2020-12-10 ENCOUNTER — Encounter: Payer: Self-pay | Admitting: Obstetrics and Gynecology

## 2020-12-10 DIAGNOSIS — O0001 Abdominal pregnancy with intrauterine pregnancy: Secondary | ICD-10-CM | POA: Insufficient documentation

## 2020-12-10 DIAGNOSIS — O26893 Other specified pregnancy related conditions, third trimester: Secondary | ICD-10-CM | POA: Diagnosis present

## 2020-12-10 DIAGNOSIS — Z3A38 38 weeks gestation of pregnancy: Secondary | ICD-10-CM | POA: Diagnosis not present

## 2020-12-10 DIAGNOSIS — Z3483 Encounter for supervision of other normal pregnancy, third trimester: Secondary | ICD-10-CM

## 2020-12-10 DIAGNOSIS — O479 False labor, unspecified: Secondary | ICD-10-CM | POA: Diagnosis present

## 2020-12-10 DIAGNOSIS — O219 Vomiting of pregnancy, unspecified: Secondary | ICD-10-CM

## 2020-12-10 DIAGNOSIS — Z3490 Encounter for supervision of normal pregnancy, unspecified, unspecified trimester: Secondary | ICD-10-CM

## 2020-12-10 DIAGNOSIS — R109 Unspecified abdominal pain: Secondary | ICD-10-CM

## 2020-12-10 NOTE — OB Triage Note (Signed)
Pt presents c/o ctx that started around 5:30 this morning. Pt states ctx woke her up. Pt states ctx are 4 mins arpart. Pt rates cvtx pain 8/10 on pain scale. Pt denies LOF. Reports some bloody show. Pt reports positive fetal movement. Pt denies recent intercourse. VSS. Fetal monitoring applied at this time. Will continue to monitor.

## 2020-12-10 NOTE — Progress Notes (Signed)
Category 1 Reactive NST. Fetal monitoring removed at this time for patient to walk and roll on the birthing ball. Will continue to monitor.

## 2020-12-10 NOTE — Progress Notes (Signed)
Category 1 Reactive strip. Fetal monitoring removed at this time for patient to walk and roll on the birthing ball. Will continue to monitor.

## 2020-12-10 NOTE — Discharge Summary (Signed)
RN reviewed discharge instructions with patient. Gave patient opportunity for questions. All questions answered at this time. Pt verbalized understanding. Pt discharged home with significant other.  °

## 2020-12-10 NOTE — Discharge Summary (Signed)
Physician Final Progress Note  Patient ID: Mary Diaz MRN: [id number] DOB/AGE: 1990-09-28 29 y.o.  Admit date: 12/10/2020 Admitting provider: Tresea Mall, CNM Discharge date: 12/10/2020   Admission Diagnoses:  1) intrauterine pregnancy at [redacted]w[redacted]d  2) Uterine contractions  Discharge Diagnoses:  Principal Problem:   Uterine contractions Active Problems:   Supervision of normal pregnancy   Abdominal pain during pregnancy in third trimester    History of Present Illness: The patient is a 30 y.o. female G2P0010 at [redacted]w[redacted]d who presents for contractions that are every 4 minutes lasting up to 50 seconds this morning. She reports good fetal movement. She denies leakage of fluid. She reports "bloody show". She was admitted for observation and placed on monitors. After reactive NST was obtained, she walked/bounced on ball for 2 hours. She was again monitored and rechecked after another 2 hours. Although there was slight cervical change, her contractions have spaced out and they are only lasting about 20-30 seconds. We had a lengthy discussion regarding early labor and the fact that she is not yet 39 weeks. She is agreeable to going home and thinks that she will be able to manage early labor better at home. Recommended hands and knees position and tub soaks to relieve back labor pain. She is discharged to home with instructions and precautions.  Past Medical History:  Diagnosis Date   Anemia    Endometriosis    Ovarian cyst     Past Surgical History:  Procedure Laterality Date   LAPAROSCOPIC OVARIAN CYSTECTOMY Right 07/04/2018   Procedure: RIGHT LAPAROSCOPIC OVARIAN CYSTECTOMY with peritoneal biopsies;  Surgeon: Nadara Mustard, MD;  Location: [facility] ORS;  Service: Gynecology;  Laterality: Right;    No current facility-administered medications on file prior to encounter.   Current Outpatient Medications on File Prior to Encounter  Medication Sig Dispense Refill   ferrous sulfate 325 (65  FE) MG EC tablet Take 325 mg by mouth daily with breakfast.     fluticasone (FLONASE) 50 MCG/ACT nasal spray Place 1 spray into both nostrils daily as needed for allergies or rhinitis.     ondansetron (ZOFRAN ODT) 4 MG disintegrating tablet Take 1 tablet (4 mg total) by mouth every 8 (eight) hours as needed for nausea. 90 tablet 2   Prenatal Vit-Fe Fumarate-FA (MULTIVITAMIN-PRENATAL) 27-0.8 MG TABS tablet Take 1 tablet by mouth daily at 12 noon.     nitrofurantoin, macrocrystal-monohydrate, (MACROBID) 100 MG capsule Take 1 capsule (100 mg total) by mouth 2 (two) times daily. (Patient not taking: Reported on 11/25/2020) 14 capsule 1    Allergies  Allergen Reactions   Percocet [Oxycodone-Acetaminophen] Nausea And Vomiting    Social History   Socioeconomic History   Marital status: Married    Spouse name: Phil   Number of children: Not on file   Years of education: Not on file   Highest education level: Not on file  Occupational History   Not on file  Tobacco Use   Smoking status: Never   Smokeless tobacco: Never  Vaping Use   Vaping Use: Never used  Substance and Sexual Activity   Alcohol use: Not Currently   Drug use: Never   Sexual activity: Yes    Birth control/protection: I.U.D., Other-see comments    Comment: undecided  Other Topics Concern   Not on file  Social History Narrative   Not on file   Social Determinants of Health   Financial Resource Strain: Not on file  Food Insecurity: Not on file  Transportation  Needs: Not on file  Physical Activity: Not on file  Stress: Not on file  Social Connections: Not on file  Intimate Partner Violence: Not on file    Family History  Adopted: Yes     Review of Systems  Constitutional:  Negative for chills and fever.  HENT:  Negative for congestion, ear discharge, ear pain, hearing loss, sinus pain and sore throat.   Eyes:  Negative for blurred vision and double vision.  Respiratory:  Negative for cough, shortness of  breath and wheezing.   Cardiovascular:  Negative for chest pain, palpitations and leg swelling.  Gastrointestinal:  Positive for abdominal pain. Negative for blood in stool, constipation, diarrhea, heartburn, melena, nausea and vomiting.  Genitourinary:  Negative for dysuria, flank pain, frequency, hematuria and urgency.  Musculoskeletal:  Positive for back pain. Negative for joint pain and myalgias.  Skin:  Negative for itching and rash.  Neurological:  Negative for dizziness, tingling, tremors, sensory change, speech change, focal weakness, seizures, loss of consciousness, weakness and headaches.  Endo/Heme/Allergies:  Negative for environmental allergies. Does not bruise/bleed easily.  Psychiatric/Behavioral:  Negative for depression, hallucinations, memory loss, substance abuse and suicidal ideas. The patient is not nervous/anxious and does not have insomnia.     Physical Exam: BP 118/73 (BP Location: Right Arm)   Pulse 98   Temp 98.2 F (36.8 C) (Oral)   Resp 16   Ht 5\' 4"  (1.626 m)   Wt 90.7 kg   LMP 03/14/2020 (Exact Date)   SpO2 100%   BMI 34.33 kg/m   Constitutional: Well nourished, well developed female in no acute distress.  HEENT: normal Skin: Warm and dry.  Cardiovascular: Regular rate and rhythm.   Extremity: no edema Respiratory: Clear to auscultation bilateral. Normal respiratory effort Abdomen: FHT present Back: no CVAT Neuro: DTRs 2+, Cranial nerves grossly intact Psych: Alert and Oriented x3. No memory deficits. Normal mood and affect.  MS: normal gait, normal bilateral lower extremity ROM/strength/stability.  Pelvic exam: per RN B. Ricky Ala 4.5/80/-1, bulging bag noted  Toco: q 3-6 lasting 20-30 seconds Fetal well being: 135 bpm, moderate variability, +accelerations, -decelerations  Consults: None  Significant Findings/ Diagnostic Studies: none  Procedures: NST  Hospital Course: The patient was admitted to Labor and Delivery Triage for observation.    Discharge Condition: good  Disposition: Discharge disposition: 01-Home or Self Care  Diet: Regular diet  Discharge Activity: Activity as tolerated  Discharge Instructions     Discharge activity:  No Restrictions   Complete by: As directed    Discharge diet:  No restrictions   Complete by: As directed    LABOR:  When conractions begin, you should start to time them from the beginning of one contraction to the beginning  of the next.  When contractions are 5 - 10 minutes apart or less and have been regular for at least an hour, you should call your health care provider.   Complete by: As directed    No sexual activity restrictions   Complete by: As directed    Notify physician for bleeding from the vagina   Complete by: As directed    Notify physician for blurring of vision or spots before the eyes   Complete by: As directed    Notify physician for chills or fever   Complete by: As directed    Notify physician for fainting spells, "black outs" or loss of consciousness   Complete by: As directed    Notify physician for increase in vaginal  discharge   Complete by: As directed    Notify physician for leaking of fluid   Complete by: As directed    Notify physician for pain or burning when urinating   Complete by: As directed    Notify physician for pelvic pressure (sudden increase)   Complete by: As directed    Notify physician for severe or continued nausea or vomiting   Complete by: As directed    Notify physician for sudden gushing of fluid from the vagina (with or without continued leaking)   Complete by: As directed    Notify physician for sudden, constant, or occasional abdominal pain   Complete by: As directed    Notify physician if baby moving less than usual   Complete by: As directed       Allergies as of 12/10/2020       Reactions   Percocet [oxycodone-acetaminophen] Nausea And Vomiting        Medication List     STOP taking these medications     nitrofurantoin (macrocrystal-monohydrate) 100 MG capsule Commonly known as: MACROBID       TAKE these medications    ferrous sulfate 325 (65 FE) MG EC tablet Take 325 mg by mouth daily with breakfast.   fluticasone 50 MCG/ACT nasal spray Commonly known as: FLONASE Place 1 spray into both nostrils daily as needed for allergies or rhinitis.   multivitamin-prenatal 27-0.8 MG Tabs tablet Take 1 tablet by mouth daily at 12 noon.   ondansetron 4 MG disintegrating tablet Commonly known as: Zofran ODT Take 1 tablet (4 mg total) by mouth every 8 (eight) hours as needed for nausea.         Total time spent taking care of this patient: 18 minutes  Signed: Rod Can, CNM  12/10/2020, 2:06 PM

## 2020-12-12 ENCOUNTER — Other Ambulatory Visit
Admission: RE | Admit: 2020-12-12 | Discharge: 2020-12-12 | Disposition: A | Discharge status: Home / Self Care | Payer: BC Managed Care – PPO | Source: Ambulatory Visit | Attending: Licensed Practical Nurse | Admitting: Licensed Practical Nurse

## 2020-12-12 ENCOUNTER — Ambulatory Visit (INDEPENDENT_AMBULATORY_CARE_PROVIDER_SITE_OTHER): Payer: BC Managed Care – PPO | Admitting: Licensed Practical Nurse

## 2020-12-12 ENCOUNTER — Other Ambulatory Visit: Payer: Self-pay

## 2020-12-12 VITALS — BP 120/80 | Wt 201.0 lb

## 2020-12-12 DIAGNOSIS — Z20822 Contact with and (suspected) exposure to covid-19: Secondary | ICD-10-CM

## 2020-12-12 DIAGNOSIS — Z01812 Encounter for preprocedural laboratory examination: Secondary | ICD-10-CM | POA: Insufficient documentation

## 2020-12-12 DIAGNOSIS — Z3A39 39 weeks gestation of pregnancy: Secondary | ICD-10-CM

## 2020-12-12 DIAGNOSIS — Z3400 Encounter for supervision of normal first pregnancy, unspecified trimester: Secondary | ICD-10-CM

## 2020-12-12 LAB — POCT URINALYSIS DIPSTICK OB
Glucose, UA: NEGATIVE
POC,PROTEIN,UA: NEGATIVE

## 2020-12-12 NOTE — H&P (Signed)
Mary Diaz is a 30 y.o. female presenting for an elective induction of labor. She has had early and regular prenatal care. Her pregnancy has been complicated by frequent nausea and vomiting. Mary Diaz has been contracting on and off since 12/5, she was evaluated twice in L and D triage for contractions. At her ROB on 12/12/20 she reported a desire to move forward with an induction.    OB History     Gravida  2   Para      Term      Preterm      AB  1   Living         SAB      IAB      Ectopic      Multiple      Live Births             Past Medical History:  Diagnosis Date   Anemia    Endometriosis    Ovarian cyst    Past Surgical History:  Procedure Laterality Date   LAPAROSCOPIC OVARIAN CYSTECTOMY Right 07/04/2018   Procedure: RIGHT LAPAROSCOPIC OVARIAN CYSTECTOMY with peritoneal biopsies;  Surgeon: Nadara Mustard, MD;  Location: [facility] ORS;  Service: Gynecology;  Laterality: Right;   Family History: family history is not on file. She was adopted. Social History:  reports that she has never smoked. She has never used smokeless tobacco. She reports that she does not currently use alcohol. She reports that she does not use drugs.     Maternal Diabetes: No Genetic Screening: Declined Maternal Ultrasounds/Referrals: Normal Fetal Ultrasounds or other Referrals:  None Maternal Substance Abuse:  No Significant Maternal Medications:  None Significant Maternal Lab Results:  None Other Comments:  None  Review of Systems  Constitutional: Negative.   Gastrointestinal:        Cramping and contractions   History Dilation: 4 Effacement (%): 80 Station: -1 Blood pressure 120/80, weight 201 lb (91.2 kg), last menstrual period 03/14/2020. Exam Physical Exam Pulmonary:     Effort: Pulmonary effort is normal.  Abdominal:     Comments: Gravid, EFW 8lbs   Musculoskeletal:        General: Normal range of motion.     Cervical back: Normal range of motion.     Right  lower leg: Edema present.     Left lower leg: Edema present.  Skin:    General: Skin is warm.  Neurological:     Mental Status: She is alert and oriented to person, place, and time.  Psychiatric:        Mood and Affect: Mood normal.    Prenatal labs: ABO, Rh: AB/Positive/-- (06/24 1457) Antibody: Negative (06/24 1457) Rubella: 1.37 (06/24 1457) RPR: Non Reactive (09/27 1016)  HBsAg: Negative (06/24 1457)  HIV: Non Reactive (09/27 1016)  GBS: Negative/-- (11/22 1516)   Assessment/Plan: Mary Diaz is a 30 y.o. female presenting for an elective induction of labor. 1) Pitocin per protocol 2) Continuous EFM  3) GBS negative 4) Membranes intact  5) Epidural for pain management when desired  Ellouise Newer [facility] 12/12/2020, 12:23 PM

## 2020-12-12 NOTE — Addendum Note (Signed)
Addended by: Cornelius Moras D on: 12/12/2020 01:26 PM   Modules accepted: Orders

## 2020-12-12 NOTE — Progress Notes (Signed)
Routine Prenatal Care Visit  Subjective  Mary Diaz is a 30 y.o. G2P0010 at [redacted]w[redacted]d being seen today for ongoing prenatal care.  She is currently monitored for the following issues for this low-risk pregnancy and has Iron deficiency anemia due to chronic blood loss; Endometriosis determined by laparoscopy; Supervision of normal pregnancy; Nausea and vomiting during pregnancy; Non-stress test reactive on fetal surveillance; Uterine contractions; Abdominal pain during pregnancy in third trimester; and [redacted] weeks gestation of pregnancy on their problem list.  ----------------------------------------------------------------------------------- Patient reports Has been contracting over the last few days, has been able to rest but feels like she is "done" and would like to schedule an induction.  Contractions: Irregular. Vag. Bleeding: None.  Movement: Present. Leaking Fluid denies.  ----------------------------------------------------------------------------------- The following portions of the patient's history were reviewed and updated as appropriate: allergies, current medications, past family history, past medical history, past social history, past surgical history and problem list. Problem list updated.  Objective  Blood pressure 120/80, weight 201 lb (91.2 kg), last menstrual period 03/14/2020. Pregravid weight 196 lb (88.9 kg) Total Weight Gain 5 lb (2.268 kg) Urinalysis: Urine Protein    Urine Glucose    Fetal Status:     Movement: Present     General:  Alert, oriented and cooperative. Patient is in no acute distress.  Skin: Skin is warm and dry. No rash noted.   Cardiovascular: Normal heart rate noted  Respiratory: Normal respiratory effort, no problems with respiration noted  Abdomen: Soft, gravid, appropriate for gestational age. Pain/Pressure: Present     Pelvic:  Cervical exam performed      4/80/-1  Extremities: Normal range of motion.     Mental Status: Normal mood and affect.  Normal behavior. Normal judgment and thought content.   Assessment   30 y.o. G2P0010 at [redacted]w[redacted]d by  12/19/2020, by Last Menstrual Period presenting for routine prenatal visit  Plan   SECOND Problems (from 04/28/20 to present)     Problem Noted Resolved   Uterine contractions 12/08/2020 by Ellwood Sayers, CNM No   Nausea and vomiting during pregnancy 05/26/2020 by Conard Novak, MD No   Supervision of normal pregnancy 05/01/2020 by Mirna Mires, CNM No   Overview Addendum 10/28/2020  4:12 PM by Mirna Mires, CNM     Clinic  Prenatal Labs  Dating done Blood type: AB/Positive/-- (06/24 1457)   Genetic Screen 1 Screen:    AFP:     Quad:     NIPS: Antibody:Negative (06/24 1457)  Anatomic [place] Normal female Rubella: 1.37 (06/24 1457)  GTT Early:               Third trimester: 9/27 85 RPR: Non Reactive (06/24 1457)   Flu vaccine  HBsAg: Negative (06/24 1457)   TDaP vaccine   10/14/2020                                             Rhogam: HIV: Non Reactive (06/24 1457)   Baby Food                                               GBS: (For PCN allergy, check sensitivities)  Contraception  [id number]:[id number]  Circumcision    Pediatrician  Support Person                   Term labor symptoms and general obstetric precautions including but not limited to vaginal bleeding, contractions, leaking of fluid and fetal movement were reviewed in detail with the patient. Please refer to After Visit Summary for other counseling recommendations.   Elective IOL scheduled for 12/14/20 at 0800 PT sent over for preadmit COVID testing  No follow-ups on file.  Carie Caddy, CNM  [facility], [facility]  12/12/20  12:44 PM

## 2020-12-13 LAB — SARS CORONAVIRUS 2 (TAT 6-24 HRS): SARS Coronavirus 2: NEGATIVE

## 2020-12-14 ENCOUNTER — Inpatient Hospital Stay
Admission: EM | Admit: 2020-12-14 | Discharge: 2020-12-16 | DRG: 806 | Disposition: A | Discharge status: Home / Self Care | Payer: BC Managed Care – PPO | Attending: Obstetrics and Gynecology | Admitting: Obstetrics and Gynecology

## 2020-12-14 ENCOUNTER — Encounter: Payer: Self-pay | Admitting: Anesthesiology

## 2020-12-14 ENCOUNTER — Inpatient Hospital Stay: Payer: BC Managed Care – PPO | Admitting: Anesthesiology

## 2020-12-14 ENCOUNTER — Other Ambulatory Visit: Payer: Self-pay

## 2020-12-14 ENCOUNTER — Encounter: Payer: Self-pay | Admitting: Obstetrics and Gynecology

## 2020-12-14 DIAGNOSIS — O219 Vomiting of pregnancy, unspecified: Secondary | ICD-10-CM

## 2020-12-14 DIAGNOSIS — Z20822 Contact with and (suspected) exposure to covid-19: Secondary | ICD-10-CM | POA: Diagnosis present

## 2020-12-14 DIAGNOSIS — Z3483 Encounter for supervision of other normal pregnancy, third trimester: Secondary | ICD-10-CM

## 2020-12-14 DIAGNOSIS — D62 Acute posthemorrhagic anemia: Secondary | ICD-10-CM | POA: Diagnosis not present

## 2020-12-14 DIAGNOSIS — O9081 Anemia of the puerperium: Secondary | ICD-10-CM | POA: Diagnosis not present

## 2020-12-14 DIAGNOSIS — O9903 Anemia complicating the puerperium: Secondary | ICD-10-CM | POA: Diagnosis not present

## 2020-12-14 DIAGNOSIS — Z3A39 39 weeks gestation of pregnancy: Secondary | ICD-10-CM

## 2020-12-14 DIAGNOSIS — O26893 Other specified pregnancy related conditions, third trimester: Secondary | ICD-10-CM | POA: Diagnosis present

## 2020-12-14 DIAGNOSIS — Z3490 Encounter for supervision of normal pregnancy, unspecified, unspecified trimester: Secondary | ICD-10-CM | POA: Diagnosis present

## 2020-12-14 DIAGNOSIS — O479 False labor, unspecified: Secondary | ICD-10-CM

## 2020-12-14 LAB — CBC
HCT: 33.8 % — ABNORMAL LOW (ref 36.0–46.0)
Hemoglobin: 11 g/dL — ABNORMAL LOW (ref 12.0–15.0)
MCH: 27.8 pg (ref 26.0–34.0)
MCHC: 32.5 g/dL (ref 30.0–36.0)
MCV: 85.6 fL (ref 80.0–100.0)
Platelets: 264 10*3/uL (ref 150–400)
RBC: 3.95 MIL/uL (ref 3.87–5.11)
RDW: 13.4 % (ref 11.5–15.5)
WBC: 9.7 10*3/uL (ref 4.0–10.5)
nRBC: 0 % (ref 0.0–0.2)

## 2020-12-14 LAB — TYPE AND SCREEN
ABO/RH(D): AB POS
Antibody Screen: NEGATIVE

## 2020-12-14 MED ORDER — ACETAMINOPHEN 325 MG PO TABS
650.0000 mg | ORAL_TABLET | ORAL | Status: DC | PRN
  Administered 2020-12-15 – 2020-12-16 (×6): 650 mg via ORAL
  Filled 2020-12-14 (×8): qty 2

## 2020-12-14 MED ORDER — OXYTOCIN-SODIUM CHLORIDE 30-0.9 UT/500ML-% IV SOLN
2.5000 [IU]/h | INTRAVENOUS | Status: DC
  Administered 2020-12-14: 2.5 [IU]/h via INTRAVENOUS

## 2020-12-14 MED ORDER — WITCH HAZEL-GLYCERIN EX PADS
1.0000 "application " | MEDICATED_PAD | CUTANEOUS | Status: DC | PRN
  Filled 2020-12-14 (×2): qty 100

## 2020-12-14 MED ORDER — BUPIVACAINE HCL (PF) 0.25 % IJ SOLN
INTRAMUSCULAR | Status: DC | PRN
  Administered 2020-12-14: 4 mL via EPIDURAL
  Administered 2020-12-14: 6 mL via EPIDURAL

## 2020-12-14 MED ORDER — LIDOCAINE HCL (PF) 1 % IJ SOLN
INTRAMUSCULAR | Status: DC | PRN
  Administered 2020-12-14: 2 mL
  Administered 2020-12-14: 3 mL

## 2020-12-14 MED ORDER — ONDANSETRON HCL 4 MG/2ML IJ SOLN
4.0000 mg | Freq: Four times a day (QID) | INTRAMUSCULAR | Status: DC | PRN
  Administered 2020-12-14: 4 mg via INTRAVENOUS
  Filled 2020-12-14: qty 2

## 2020-12-14 MED ORDER — LACTATED RINGERS IV SOLN: INTRAVENOUS | Status: DC

## 2020-12-14 MED ORDER — OXYTOCIN-SODIUM CHLORIDE 30-0.9 UT/500ML-% IV SOLN
1.0000 m[IU]/min | INTRAVENOUS | Status: DC
  Administered 2020-12-14: 2 m[IU]/min via INTRAVENOUS
  Filled 2020-12-14: qty 1000

## 2020-12-14 MED ORDER — PHENYLEPHRINE 40 MCG/ML (10ML) SYRINGE FOR IV PUSH (FOR BLOOD PRESSURE SUPPORT): 80.0000 ug | PREFILLED_SYRINGE | INTRAVENOUS | Status: DC | PRN

## 2020-12-14 MED ORDER — IBUPROFEN 600 MG PO TABS
600.0000 mg | ORAL_TABLET | Freq: Four times a day (QID) | ORAL | Status: DC
  Administered 2020-12-15 – 2020-12-16 (×7): 600 mg via ORAL
  Filled 2020-12-14 (×8): qty 1

## 2020-12-14 MED ORDER — TERBUTALINE SULFATE 1 MG/ML IJ SOLN: 0.2500 mg | Freq: Once | INTRAMUSCULAR | Status: DC | PRN

## 2020-12-14 MED ORDER — HYDROCODONE-ACETAMINOPHEN 5-325 MG PO TABS: 1.0000 | ORAL_TABLET | ORAL | Status: DC | PRN

## 2020-12-14 MED ORDER — ONDANSETRON HCL 4 MG PO TABS
4.0000 mg | ORAL_TABLET | ORAL | Status: DC | PRN
  Administered 2020-12-15 – 2020-12-16 (×5): 4 mg via ORAL
  Filled 2020-12-14 (×6): qty 1

## 2020-12-14 MED ORDER — FLEET ENEMA 7-19 GM/118ML RE ENEM: 1.0000 | ENEMA | RECTAL | Status: DC | PRN

## 2020-12-14 MED ORDER — EPHEDRINE 5 MG/ML INJ
10.0000 mg | INTRAVENOUS | Status: DC | PRN
  Administered 2020-12-14: 5 mg via INTRAVENOUS
  Filled 2020-12-14: qty 4

## 2020-12-14 MED ORDER — SIMETHICONE 80 MG PO CHEW
80.0000 mg | CHEWABLE_TABLET | ORAL | Status: DC | PRN
  Administered 2020-12-15: 80 mg via ORAL
  Filled 2020-12-14: qty 1

## 2020-12-14 MED ORDER — DIPHENHYDRAMINE HCL 50 MG/ML IJ SOLN: 12.5000 mg | INTRAMUSCULAR | Status: DC | PRN

## 2020-12-14 MED ORDER — SOD CITRATE-CITRIC ACID 500-334 MG/5ML PO SOLN: 30.0000 mL | ORAL | Status: DC | PRN

## 2020-12-14 MED ORDER — DIBUCAINE (PERIANAL) 1 % EX OINT
1.0000 "application " | TOPICAL_OINTMENT | CUTANEOUS | Status: DC | PRN
  Filled 2020-12-14: qty 28

## 2020-12-14 MED ORDER — EPHEDRINE 5 MG/ML INJ: 10.0000 mg | INTRAVENOUS | Status: DC | PRN

## 2020-12-14 MED ORDER — OXYTOCIN 10 UNIT/ML IJ SOLN
INTRAMUSCULAR | Status: AC
  Filled 2020-12-14: qty 2

## 2020-12-14 MED ORDER — COCONUT OIL OIL
1.0000 "application " | TOPICAL_OIL | Status: DC | PRN
  Filled 2020-12-14: qty 120

## 2020-12-14 MED ORDER — MISOPROSTOL 25 MCG QUARTER TABLET: 25.0000 ug | ORAL_TABLET | ORAL | Status: DC | PRN

## 2020-12-14 MED ORDER — PRENATAL MULTIVITAMIN CH
1.0000 | ORAL_TABLET | Freq: Every day | ORAL | Status: DC
  Administered 2020-12-15 – 2020-12-16 (×2): 1 via ORAL
  Filled 2020-12-14 (×2): qty 1

## 2020-12-14 MED ORDER — LIDOCAINE-EPINEPHRINE (PF) 1.5 %-1:200000 IJ SOLN
INTRAMUSCULAR | Status: DC | PRN
Start: 2020-12-14 — End: 2020-12-14
  Administered 2020-12-14: 5 mL via PERINEURAL

## 2020-12-14 MED ORDER — OXYTOCIN BOLUS FROM INFUSION
333.0000 mL | Freq: Once | INTRAVENOUS | Status: AC
  Administered 2020-12-14: 333 mL via INTRAVENOUS

## 2020-12-14 MED ORDER — LIDOCAINE HCL (PF) 1 % IJ SOLN
30.0000 mL | INTRAMUSCULAR | Status: DC | PRN
  Filled 2020-12-14: qty 30

## 2020-12-14 MED ORDER — MISOPROSTOL 200 MCG PO TABS
ORAL_TABLET | ORAL | Status: AC
  Filled 2020-12-14: qty 4

## 2020-12-14 MED ORDER — AMMONIA AROMATIC IN INHA
RESPIRATORY_TRACT | Status: AC
  Filled 2020-12-14: qty 10

## 2020-12-14 MED ORDER — SENNOSIDES-DOCUSATE SODIUM 8.6-50 MG PO TABS
2.0000 | ORAL_TABLET | ORAL | Status: DC
  Administered 2020-12-15 – 2020-12-16 (×2): 2 via ORAL
  Filled 2020-12-14 (×2): qty 2

## 2020-12-14 MED ORDER — LACTATED RINGERS IV SOLN
500.0000 mL | INTRAVENOUS | Status: DC | PRN
  Administered 2020-12-14: 500 mL via INTRAVENOUS

## 2020-12-14 MED ORDER — FENTANYL-BUPIVACAINE-NACL 0.5-0.125-0.9 MG/250ML-% EP SOLN
EPIDURAL | Status: AC
  Filled 2020-12-14: qty 250

## 2020-12-14 MED ORDER — BENZOCAINE-MENTHOL 20-0.5 % EX AERO
1.0000 "application " | INHALATION_SPRAY | CUTANEOUS | Status: DC | PRN
  Filled 2020-12-14 (×2): qty 56

## 2020-12-14 MED ORDER — FENTANYL-BUPIVACAINE-NACL 0.5-0.125-0.9 MG/250ML-% EP SOLN
12.0000 mL/h | EPIDURAL | Status: DC | PRN
  Administered 2020-12-14: 12 mL/h via EPIDURAL

## 2020-12-14 MED ORDER — ONDANSETRON HCL 4 MG/2ML IJ SOLN
4.0000 mg | INTRAMUSCULAR | Status: DC | PRN
  Administered 2020-12-15: 4 mg via INTRAVENOUS
  Filled 2020-12-14: qty 2

## 2020-12-14 MED ORDER — DIPHENHYDRAMINE HCL 25 MG PO CAPS: 25.0000 mg | ORAL_CAPSULE | Freq: Four times a day (QID) | ORAL | Status: DC | PRN

## 2020-12-14 MED ORDER — LACTATED RINGERS IV SOLN
500.0000 mL | Freq: Once | INTRAVENOUS | Status: AC
  Administered 2020-12-14: 500 mL via INTRAVENOUS

## 2020-12-14 NOTE — Anesthesia Procedure Notes (Signed)
Epidural Patient location during procedure: OB Start time: 12/14/2020 11:50 AM  Staffing Anesthesiologist: Briant Cedar, MD Performed: anesthesiologist   Preanesthetic Checklist Completed: patient identified, IV checked, site marked, risks and benefits discussed, surgical consent, monitors and equipment checked, pre-op evaluation and timeout performed  Epidural Patient position: sitting Prep: Betadine Patient monitoring: heart rate, continuous pulse ox and blood pressure Approach: midline Location: L3-L4 Injection technique: LOR air  Needle:  Needle type: Tuohy  Needle gauge: 17 G Needle length: 9 cm Needle insertion depth: 5 cm Catheter size: 19 Gauge Catheter at skin depth: 11 cm Test dose: negative and 1.5% lidocaine with Epi 1:200 K  Assessment Events: blood not aspirated and injection not painful

## 2020-12-14 NOTE — Progress Notes (Signed)
Anesthesia at the bedside to evaluate patient's epidural at this time

## 2020-12-14 NOTE — Progress Notes (Signed)
RN called to bedside after several attempts to try and get the patient epidural coverage on the right side via positioning and the pt using her bolus button. Pt still not getting coverage on the right side. RN to notify anesthesia. Anesthesia made aware and making their way to the bedside to further assess. Will continue to monitor.

## 2020-12-14 NOTE — Anesthesia Preprocedure Evaluation (Signed)
Anesthesia Evaluation    Airway Mallampati: II  TM Distance: >3 FB     Dental   Pulmonary    Pulmonary exam normal        Cardiovascular  Rhythm:Regular Rate:Normal     Neuro/Psych    GI/Hepatic   Endo/Other    Renal/GU      Musculoskeletal   Abdominal   Peds  Hematology  (+) anemia ,   Anesthesia Other Findings   Reproductive/Obstetrics (+) Pregnancy                             Anesthesia Physical Anesthesia Plan  ASA: 2  Anesthesia Plan: Epidural   Post-op Pain Management:    Induction:   PONV Risk Score and Plan:   Airway Management Planned:   Additional Equipment:   Intra-op Plan:   Post-operative Plan:   Informed Consent:   Plan Discussed with:   Anesthesia Plan Comments:         Anesthesia Quick Evaluation

## 2020-12-14 NOTE — H&P (Signed)
Obstetric H&P   Chief Complaint: Elective IOL  Prenatal Care Provider: [facility]  History of Present Illness: 30 y.o. G2P0010 [redacted]w[redacted]d by 12/19/2020, by Last Menstrual Period presenting to L&D for elective IOL.  Pregnancy uncomplicated. +FM, no LOF, no VB, no ctx   Pregravid weight 88.9 kg Total Weight Gain 2.268 kg  SECOND Problems (from 04/28/20 to present)     Problem Noted Resolved   Uterine contractions 12/08/2020 by Ellwood Sayers, CNM No   Nausea and vomiting during pregnancy 05/26/2020 by Conard Novak, MD No   Supervision of normal pregnancy 05/01/2020 by Mirna Mires, CNM No   Overview Addendum 10/28/2020  4:12 PM by Mirna Mires, CNM     Clinic  Prenatal Labs  Dating done Blood type: AB/Positive/-- (06/24 1457)   Genetic Screen 1 Screen:    AFP:     Quad:     NIPS: Antibody:Negative (06/24 1457)  Anatomic [place] Normal female Rubella: 1.37 (06/24 1457)  GTT Early:               Third trimester: 9/27 85 RPR: Non Reactive (06/24 1457)   Flu vaccine  HBsAg: Negative (06/24 1457)   TDaP vaccine   10/14/2020                                             Rhogam: HIV: Non Reactive (06/24 1457)   Baby Food                                               GBS: (For PCN allergy, check sensitivities)  Contraception  ZHY:[id number]  Circumcision    Pediatrician    Support Person                   Review of Systems: 10 point review of systems negative unless otherwise noted in HPI  Past Medical History: Patient Active Problem List   Diagnosis Date Noted   Encounter for elective induction of labor 12/14/2020   Abdominal pain during pregnancy in third trimester 12/10/2020   [redacted] weeks gestation of pregnancy    Uterine contractions 12/08/2020   Non-stress test reactive on fetal surveillance    Nausea and vomiting during pregnancy 05/26/2020   Supervision of normal pregnancy 05/01/2020     Clinic  Prenatal Labs  Dating done Blood type: AB/Positive/-- (06/24 1457)    Genetic Screen 1 Screen:    AFP:     Quad:     NIPS: Antibody:Negative (06/24 1457)  Anatomic [place] Normal female Rubella: 1.37 (06/24 1457)  GTT Early:               Third trimester: 9/27 85 RPR: Non Reactive (06/24 1457)   Flu vaccine  HBsAg: Negative (06/24 1457)   TDaP vaccine   10/14/2020                                             Rhogam: HIV: Non Reactive (06/24 1457)   Baby Food  GBS: (For PCN allergy, check sensitivities)  Contraception  [id number]:[id number]  Circumcision    Pediatrician    Support Person            Endometriosis determined by laparoscopy 07/04/2018   Iron deficiency anemia due to chronic blood loss 07/28/2017    Past Surgical History: Past Surgical History:  Procedure Laterality Date   LAPAROSCOPIC OVARIAN CYSTECTOMY Right 07/04/2018   Procedure: RIGHT LAPAROSCOPIC OVARIAN CYSTECTOMY with peritoneal biopsies;  Surgeon: Nadara Mustard, MD;  Location: [facility] ORS;  Service: Gynecology;  Laterality: Right;    Past Obstetric History: # 1 - Date: None, Sex: None, Weight: None, GA: None, Delivery: None, Apgar1: None, Apgar5: None, Living: None, Birth Comments: None  # 2 - Date: None, Sex: None, Weight: None, GA: None, Delivery: None, Apgar1: None, Apgar5: None, Living: None, Birth Comments: None   Past Gynecologic History:  Family History: Family History  Adopted: Yes    Social History: Social History   Socioeconomic History   Marital status: Married    Spouse name: Phil   Number of children: Not on file   Years of education: Not on file   Highest education level: Not on file  Occupational History   Not on file  Tobacco Use   Smoking status: Never   Smokeless tobacco: Never  Vaping Use   Vaping Use: Never used  Substance and Sexual Activity   Alcohol use: Not Currently   Drug use: Never   Sexual activity: Yes    Birth control/protection: I.U.D., Other-see comments    Comment: undecided  Other Topics  Concern   Not on file  Social History Narrative   Not on file   Social Determinants of Health   Financial Resource Strain: Not on file  Food Insecurity: Not on file  Transportation Needs: Not on file  Physical Activity: Not on file  Stress: Not on file  Social Connections: Not on file  Intimate Partner Violence: Not on file    Medications: Prior to Admission medications   Medication Sig Start Date End Date Taking? Authorizing Provider  ferrous sulfate 325 (65 FE) MG EC tablet Take 325 mg by mouth daily with breakfast.   Yes [provider]  fluticasone (FLONASE) 50 MCG/ACT nasal spray Place 1 spray into both nostrils daily as needed for allergies or rhinitis.   Yes [provider]  ondansetron (ZOFRAN ODT) 4 MG disintegrating tablet Take 1 tablet (4 mg total) by mouth every 8 (eight) hours as needed for nausea. 05/26/20  Yes Conard Novak, MD  Prenatal Vit-Fe Fumarate-FA (MULTIVITAMIN-PRENATAL) 27-0.8 MG TABS tablet Take 1 tablet by mouth daily at 12 noon.   Yes [provider]    Allergies: Allergies  Allergen Reactions   Percocet [Oxycodone-Acetaminophen] Nausea And Vomiting    Physical Exam: Vitals: Blood pressure 118/77, pulse (!) 120, temperature 98.2 F (36.8 C), temperature source Oral, resp. rate 18, height 5\' 4"  (1.626 m), weight 91.2 kg, last menstrual period 03/14/2020, SpO2 100 %.  FHT: 130, moderate, +accels, no decels Toco: q31min  General: NAD HEENT: normocephalic, anicteric Pulmonary: No increased work of breathing Cardiovascular: RRR, distal pulses 2+ Abdomen: Gravid, non-tender Leopolds: vtx Genitourinary:  Dilation: 4 Effacement (%): 70 Cervical Position: Posterior Station: -1 Presentation: Vertex Exam by:: [email or web address] RN  Extremities: no edema, erythema, or tenderness Neurologic: Grossly intact Psychiatric: mood appropriate, affect full  Labs: Results for orders placed or performed during the hospital encounter  of 12/14/20 (from the past 24 hour(s))  CBC  Status: Abnormal   Collection Time: 12/14/20  8:23 AM  Result Value Ref Range   WBC 9.7 4.0 - 10.5 K/uL   RBC 3.95 3.87 - 5.11 MIL/uL   Hemoglobin 11.0 (L) 12.0 - 15.0 g/dL   HCT 40.8 (L) 14.4 - 81.8 %   MCV 85.6 80.0 - 100.0 fL   MCH 27.8 26.0 - 34.0 pg   MCHC 32.5 30.0 - 36.0 g/dL   RDW 56.3 14.9 - 70.2 %   Platelets 264 150 - 400 K/uL   nRBC 0.0 0.0 - 0.2 %  Type and screen     Status: None   Collection Time: 12/14/20  8:23 AM  Result Value Ref Range   ABO/RH(D) AB POS    Antibody Screen NEG    Sample Expiration      12/17/2020,2359 Performed at [facility], [address] [place], [place] [address]     Assessment: 30 y.o. G2P0010 [redacted]w[redacted]d by 12/19/2020, by Last Menstrual Period presenting for elective IOL  Plan: 1)IOL - started on pitocin, AROM clear  2) Fetus - cat I tracing  3) PNL - Blood type --/--/AB POS (12/11 0823) / Anti-bodyscreen NEG (12/11 0823) / Rubella 1.37 (06/24 1457) / Varicella Immune / RPR Non Reactive (09/27 1016) / HBsAg Negative (06/24 1457) / HIV Non Reactive (09/27 1016) / 1-hr OGTT 85 / GBS Negative/-- (11/22 1516)  4) Immunization History -  Immunization History  Administered Date(s) Administered   Tdap 10/14/2020    5) Disposition -   Vena Austria, MD, [facility], [facility] 12/14/2020, 11:59 AM

## 2020-12-14 NOTE — Discharge Summary (Signed)
OB Discharge Summary     Patient Name: Mary Diaz DOB: 12-Feb-1990 MRN: [id number]  Date of admission: 12/14/2020 Delivering MD: Vena Austria   Date of discharge: 12/16/2020  Admitting diagnosis: Encounter for elective induction of labor [Z34.90] Intrauterine pregnancy: [redacted]w[redacted]d     Secondary diagnosis:  Principal Problem:   Encounter for elective induction of labor Active Problems:   SVD (spontaneous vaginal delivery)  Additional problems: none     Discharge diagnosis: Term Pregnancy Delivered                                                                                                Post partum procedures: none  Augmentation: AROM and Pitocin  Complications: None  Hospital course:  Induction of Labor With Vaginal Delivery   30 y.o. yo G2P0010 at [redacted]w[redacted]d was admitted to the hospital 12/14/2020 for induction of labor.  Indication for induction: Elective.  Patient had an uncomplicated labor course as follows: Membrane Rupture Time/Date: 10:33 AM ,12/14/2020   Delivery Method:Vaginal, Spontaneous  Episiotomy: None  Lacerations:  None  Details of delivery can be found in separate delivery note.  Patient had a routine postpartum course. Patient is discharged home 12/16/20.  Newborn Data: Birth date:12/14/2020  Birth time:10:33 PM  Gender:Female  Living status:Living  Apgars:9 ,9  Weight:3374 g   Physical exam  Vitals:   12/15/20 1204 12/15/20 1559 12/15/20 2306 12/16/20 0832  BP: 128/86 125/89 115/74 111/72  Pulse: 98 97 85 85  Resp: 18 18 18 20   Temp: 98 F (36.7 C) 99 F (37.2 C) 98.5 F (36.9 C) 98 F (36.7 C)  TempSrc: Oral Oral Oral Oral  SpO2:   99% 100%  Weight:      Height:       General: alert, cooperative, and no distress Lochia: appropriate Uterine Fundus: firm  DVT Evaluation: No evidence of DVT seen on physical exam. Labs: Lab Results  Component Value Date   WBC 18.7 (H) 12/15/2020   HGB 9.6 (L) 12/15/2020   HCT 29.3 (L)  12/15/2020   MCV 84.0 12/15/2020   PLT 237 12/15/2020   CMP Latest Ref Rng & Units 11/18/2020  Glucose 70 - 99 mg/dL 11/20/2020)  BUN 6 - 20 mg/dL 5(L)  Creatinine 353(G - 1.00 mg/dL 9.92  Sodium 4.26 - 834 mmol/L 135  Potassium 3.5 - 5.1 mmol/L 3.8  Chloride 98 - 111 mmol/L 104  CO2 22 - 32 mmol/L 24  Calcium 8.9 - 10.3 mg/dL 196)  Total Protein 6.5 - 8.1 g/dL 6.0(L)  Total Bilirubin 0.3 - 1.2 mg/dL 0.3  Alkaline Phos 38 - 126 U/L 136(H)  AST 15 - 41 U/L 23  ALT 0 - 44 U/L 25    Discharge instruction: per After Visit Summary and "Baby and Me Booklet".  After visit meds:  Allergies as of 12/16/2020       Reactions   Percocet [oxycodone-acetaminophen] Nausea And Vomiting        Medication List     TAKE these medications    ferrous sulfate 325 (65 FE) MG EC tablet Take 325 mg  by mouth daily with breakfast.   fluticasone 50 MCG/ACT nasal spray Commonly known as: FLONASE Place 1 spray into both nostrils daily as needed for allergies or rhinitis.   ibuprofen 600 MG tablet Commonly known as: ADVIL Take 1 tablet (600 mg total) by mouth every 6 (six) hours.   multivitamin-prenatal 27-0.8 MG Tabs tablet Take 1 tablet by mouth daily at 12 noon.   ondansetron 4 MG disintegrating tablet Commonly known as: Zofran ODT Take 1 tablet (4 mg total) by mouth every 8 (eight) hours as needed for nausea.        Diet: routine diet  Activity: Advance as tolerated. Pelvic rest for 6 weeks.   Outpatient follow up:6 weeks Follow up Appt:No future appointments. Follow up Visit:No follow-ups on file.  Postpartum contraception: Natural Family Planning  Newborn Data: Live born female  Birth Weight:   APGAR: 9, 9  Newborn Delivery   Birth date/time: 12/14/2020 22:33:00 Delivery type: Vaginal, Spontaneous      Baby Feeding: Bottle Disposition:home with mother   12/16/2020 Vena Austria, MD

## 2020-12-15 ENCOUNTER — Encounter: Payer: Self-pay | Admitting: Obstetrics and Gynecology

## 2020-12-15 LAB — CBC
HCT: 29.3 % — ABNORMAL LOW (ref 36.0–46.0)
Hemoglobin: 9.6 g/dL — ABNORMAL LOW (ref 12.0–15.0)
MCH: 27.5 pg (ref 26.0–34.0)
MCHC: 32.8 g/dL (ref 30.0–36.0)
MCV: 84 fL (ref 80.0–100.0)
Platelets: 237 10*3/uL (ref 150–400)
RBC: 3.49 MIL/uL — ABNORMAL LOW (ref 3.87–5.11)
RDW: 13.6 % (ref 11.5–15.5)
WBC: 18.7 10*3/uL — ABNORMAL HIGH (ref 4.0–10.5)
nRBC: 0 % (ref 0.0–0.2)

## 2020-12-15 LAB — RPR: RPR Ser Ql: NONREACTIVE

## 2020-12-15 MED ORDER — FAMOTIDINE 20 MG PO TABS
10.0000 mg | ORAL_TABLET | Freq: Two times a day (BID) | ORAL | Status: DC
  Administered 2020-12-15 – 2020-12-16 (×2): 10 mg via ORAL
  Filled 2020-12-15 (×2): qty 1

## 2020-12-15 MED ORDER — FAMOTIDINE 20 MG PO TABS: 10.0000 mg | ORAL_TABLET | Freq: Two times a day (BID) | ORAL | Status: DC

## 2020-12-15 NOTE — Anesthesia Postprocedure Evaluation (Signed)
Anesthesia Post Note  Patient: EMMANUEL ERCOLE  Procedure(s) Performed: AN AD HOC LABOR EPIDURAL  Patient location during evaluation: Mother Baby Anesthesia Type: Epidural Level of consciousness: awake and alert and oriented Pain management: pain level controlled Vital Signs Assessment: post-procedure vital signs reviewed and stable Respiratory status: respiratory function stable Cardiovascular status: stable Postop Assessment: no headache, no backache, patient able to bend at knees, no apparent nausea or vomiting, able to ambulate and adequate PO intake Anesthetic complications: no   No notable events documented.   Last Vitals:  Vitals:   12/15/20 0251 12/15/20 0756  BP: 110/77 121/88  Pulse: (!) 111 99  Resp: 18 20  Temp:  36.8 C  SpO2: 97% 98%    Last Pain:  Vitals:   12/15/20 0756  TempSrc: Oral  PainSc:                  Zachary [place]

## 2020-12-15 NOTE — Lactation Note (Signed)
This note was copied from a baby's chart. Lactation Consultation Note  Patient Name: Mary Diaz [id number] Date: 12/15/2020 Reason for consult: Initial assessment;Primapara;Term Age:30 hours  Initial lactation visit. Mom is P1, SVD 10 hours ago. Mom desires exclusive breastmilk given to baby via pumping. Mom was pre-pumping to induce labor and has up to 33mL colostrum at home, and has pumped 2x since delivery and received 66ml and 75ml respectively.  This last time mom pumped she did not get a volume sufficient for feeding. LC talked to mom about her feeding plan and desires to exclusively pump; mom understands that the first few days can be difficult with extracting appropriate volumes, encouraged hand expression pre/post pumping for optimal output, warmth, breast massage and compression while pumping as well.  We discussed supplementation options while mom is working to build her supply here in hospital, mom opts for donor breast milk. We discussed paced bottle feeding, early cues, feeding with cues, and output expectations for first 24 hours.  Whiteboard updated with LC name/number, encouraged to call with questions/support as needed.  Maternal Data Has patient been taught Hand Expression?: Yes Does the patient have breastfeeding experience prior to this delivery?: No  Feeding Mother's Current Feeding Choice: Breast Milk Nipple Type: Slow - flow  LATCH Score                    Lactation Tools Discussed/Used Tools: Pump Breast pump type: Double-Electric Breast Pump Reason for Pumping: mom's choice to pump and bottle feed Pumping frequency: q 3 hours  Interventions Interventions: Breast feeding basics reviewed;Hand express;DEBP;Education;Pace feeding  Discharge Pump: Personal  Consult Status Consult Status: Follow-up Date: 12/15/20 Follow-up type: In-patient    Danford Bad 12/15/2020, 9:01 AM

## 2020-12-15 NOTE — Plan of Care (Signed)
  Problem: Education: Goal: Knowledge of General Education information will improve Description: Including pain rating scale, medication(s)/side effects and non-pharmacologic comfort measures 12/15/2020 0126 by Rexford Maus, RN Outcome: Completed/Met 12/15/2020 0126 by Rexford Maus, RN Outcome: Progressing

## 2020-12-15 NOTE — Progress Notes (Signed)
   Subjective:  Doing well postpartum day 1; she is tolerating regular diet, her pain is controlled with po medication, she is ambulating and voiding without difficulty. She is pumping and feeding by bottle.  Objective:  Vital signs in last 24 hours: Temp:  [97.3 F (36.3 C)-99.3 F (37.4 C)] 98.3 F (36.8 C) (12/12 0756) Pulse Rate:  [75-166] 99 (12/12 0756) Resp:  [18-20] 20 (12/12 0756) BP: (88-132)/(42-90) 121/88 (12/12 0756) SpO2:  [96 %-100 %] 98 % (12/12 0756)    General: NAD Pulmonary: no increased work of breathing Abdomen: non-distended, non-tender, fundus firm at level of umbilicus Extremities: no edema, no erythema, no tenderness  Results for orders placed or performed during the hospital encounter of 12/14/20 (from the past 72 hour(s))  CBC     Status: Abnormal   Collection Time: 12/14/20  8:23 AM  Result Value Ref Range   WBC 9.7 4.0 - 10.5 K/uL   RBC 3.95 3.87 - 5.11 MIL/uL   Hemoglobin 11.0 (L) 12.0 - 15.0 g/dL   HCT 37.1 (L) 69.6 - 78.9 %   MCV 85.6 80.0 - 100.0 fL   MCH 27.8 26.0 - 34.0 pg   MCHC 32.5 30.0 - 36.0 g/dL   RDW 38.1 01.7 - 51.0 %   Platelets 264 150 - 400 K/uL   nRBC 0.0 0.0 - 0.2 %    Comment: Performed at [facility], [address] [place], [place] [address]  Type and screen     Status: None   Collection Time: 12/14/20  8:23 AM  Result Value Ref Range   ABO/RH(D) AB POS    Antibody Screen NEG    Sample Expiration      12/17/2020,2359 Performed at [facility], [address] [place], [place] [address]   CBC     Status: Abnormal   Collection Time: 12/15/20  6:06 AM  Result Value Ref Range   WBC 18.7 (H) 4.0 - 10.5 K/uL   RBC 3.49 (L) 3.87 - 5.11 MIL/uL   Hemoglobin 9.6 (L) 12.0 - 15.0 g/dL   HCT 23.5 (L) 36.1 - 44.3 %   MCV 84.0 80.0 - 100.0 fL   MCH 27.5 26.0 - 34.0 pg   MCHC 32.8 30.0 - 36.0 g/dL   RDW 15.4 00.8 - 67.6 %   Platelets 237 150 - 400 K/uL   nRBC 0.0 0.0 - 0.2 %    Comment: Performed at  [facility], [address] [place], [place] [address]    Assessment:   30 y.o. G2P1011 postpartum day # 1, lactating  Plan:    1) Acute blood loss anemia - hemodynamically stable and asymptomatic - po ferrous sulfate  2) Blood Type --/--/AB POS (12/11 3267) / Rubella 1.37 (06/24 1457) / Varicella Immune  3) TDAP status  given antepartum  4) Feeding plan  pump/breastfeed  5)  Education given regarding options for contraception, as well as compatibility with breast feeding if applicable.  Patient plans on IUD for contraception.  6) Disposition: continue current care   Tresea Mall, CNM [facility] 12/15/2020, 10:52 AM

## 2020-12-15 NOTE — Plan of Care (Signed)
  Problem: Education: Goal: Knowledge of General Education information will improve Description Including pain rating scale, medication(s)/side effects and non-pharmacologic comfort measures Outcome: Progressing   

## 2020-12-16 MED ORDER — IBUPROFEN 600 MG PO TABS
600.0000 mg | ORAL_TABLET | Freq: Four times a day (QID) | ORAL | 0 refills | Status: AC
Start: 2020-12-16 — End: ?

## 2020-12-16 NOTE — Progress Notes (Signed)
Mother discharged.  Discharge instructions given.  Mother verbalizes understanding.  Transported by auxiliary.  

## 2020-12-16 NOTE — Discharge Instructions (Signed)

## 2020-12-16 NOTE — Lactation Note (Signed)
This note was copied from a baby's chart. Lactation Consultation Note  Patient Name: Mary Diaz [id number] Date: 12/16/2020 Reason for consult: Follow-up assessment;Term;RN request Age:30 hours  Lactation follow-up visit. Baby received donor breastmilk overnight with small volumes of mom's expressed colostrum. Per report baby was super fussy and spitty throughout the night. Overnight the RN reiterated paced-bottle feeding and changed nipple to help baby with feedings.  Mom was inconsistent with pumping overnight due to caring for baby, but is back on track this morning starting at 7am.  Baby to receive circumcision at some point today and then family planning on discharge. LC reiterated the importance of consistent pumping, even with baby behaviors, and importance of pumping overnight when hormones are highest. We discussed potential timing of transitional and mature milk, tips for fully emptying the breast, and hand expression pre/post pumping for optimal output.  Mom has her own DEBP at home, feels confident with her pumping journey and being home. Family is not opposed to supplement and we discussed signs that baby may need supplement. We reviewed appropriate volumes sizes via bottle feeding as baby grows and continued output expectations. Information for outpatient lactation services provided and community breastfeeding support. Encouraged to call for ongoing BF support as needed.  Maternal Data Has patient been taught Hand Expression?: Yes Does the patient have breastfeeding experience prior to this delivery?: No  Feeding Mother's Current Feeding Choice: Breast Milk  LATCH Score                    Lactation Tools Discussed/Used Tools: Pump Breast pump type: Double-Electric Breast Pump Reason for Pumping: exclusive pumping  Interventions Interventions: DEBP;Education;Pace feeding  Discharge Discharge Education: Engorgement and breast care;Warning signs for feeding  baby;Outpatient recommendation Pump: Personal  Consult Status Consult Status: Complete Date: 12/16/20 Follow-up type: Call as needed    Danford Bad 12/16/2020, 9:01 AM

## 2021-01-07 NOTE — Telephone Encounter (Signed)
Patient called with blurred vision, nausea, headache and feeling hot/faint. Advice to got to the ER for evaluation. Patient stated her husband was in route to take her to the ER. Headache 3-4/10 pain, but progressively getting worse.

## 2021-01-28 ENCOUNTER — Ambulatory Visit (INDEPENDENT_AMBULATORY_CARE_PROVIDER_SITE_OTHER): Payer: BLUE CROSS/BLUE SHIELD | Admitting: Obstetrics

## 2021-01-28 ENCOUNTER — Other Ambulatory Visit: Payer: Self-pay

## 2021-01-28 ENCOUNTER — Encounter: Payer: Self-pay | Admitting: Obstetrics

## 2021-01-28 ENCOUNTER — Ambulatory Visit: Payer: BC Managed Care – PPO | Admitting: Obstetrics

## 2021-01-28 NOTE — Progress Notes (Signed)
Postpartum Visit  Chief Complaint:  Chief Complaint  Patient presents with   Postpartum Care    History of Present Illness: Patient is a 31 y.o. G2P1011 presents for postpartum visit.  Date of delivery: 12/14/2020 Type of delivery: Vaginal delivery - Vacuum or forceps assisted  no Episiotomy No.  Laceration: no  Pregnancy or labor problems:  no Any problems since the delivery:  no  Newborn Details:  SINGLETON :  1. Baby's name: female. Birth weight: 7lbs 7 oz Maternal Details:  Breast Feeding:  she is both breast and bottle feeding. She has had some vaginal bleeding for two dasy after her initial lochia flow ceased- may be her first period Post partum depression/anxiety noted:  no Edinburgh Post-Partum Depression Score:  6  Date of last PAP: 4/25/2022normal   Past Medical History:  Diagnosis Date   Anemia    Endometriosis    Ovarian cyst     Past Surgical History:  Procedure Laterality Date   LAPAROSCOPIC OVARIAN CYSTECTOMY Right 07/04/2018   Procedure: RIGHT LAPAROSCOPIC OVARIAN CYSTECTOMY with peritoneal biopsies;  Surgeon: Nadara Mustard, MD;  Location: [facility]S;  Service: Gynecology;  Laterality: Right;    Prior to Admission medications   Medication Sig Start Date End Date Taking? Authorizing Provider  fluticasone (FLONASE) 50 MCG/ACT nasal spray Place 1 spray into both nostrils daily as needed for allergies or rhinitis.   Yes [provider]  ferrous sulfate 325 (65 FE) MG EC tablet Take 325 mg by mouth daily with breakfast. Patient not taking: Reported on 01/28/2021    [provider]  ibuprofen (ADVIL) 600 MG tablet Take 1 tablet (600 mg total) by mouth every 6 (six) hours. Patient not taking: Reported on 01/28/2021 12/16/20   Vena Austria, MD  ondansetron (ZOFRAN ODT) 4 MG disintegrating tablet Take 1 tablet (4 mg total) by mouth every 8 (eight) hours as needed for nausea. Patient not taking: Reported on 01/28/2021 05/26/20   Conard Novak, MD  Prenatal Vit-Fe Fumarate-FA (MULTIVITAMIN-PRENATAL) 27-0.8 MG TABS tablet Take 1 tablet by mouth daily at 12 noon. Patient not taking: Reported on 01/28/2021    [provider]    Allergies  Allergen Reactions   Percocet [Oxycodone-Acetaminophen] Nausea And Vomiting     Social History   Socioeconomic History   Marital status: Married    Spouse name: Phil   Number of children: Not on file   Years of education: Not on file   Highest education level: Not on file  Occupational History   Not on file  Tobacco Use   Smoking status: Never   Smokeless tobacco: Never  Vaping Use   Vaping Use: Never used  Substance and Sexual Activity   Alcohol use: Not Currently   Drug use: Never   Sexual activity: Yes    Birth control/protection: I.U.D., Other-see comments    Comment: undecided  Other Topics Concern   Not on file  Social History Narrative   Not on file   Social Determinants of Health   Financial Resource Strain: Not on file  Food Insecurity: Not on file  Transportation Needs: Not on file  Physical Activity: Not on file  Stress: Not on file  Social Connections: Not on file  Intimate Partner Violence: Not on file    Family History  Adopted: Yes    ROS   Physical Exam BP 126/84    Ht 5\' 4"  (1.626 m)    Wt 184 lb (83.5 kg)    Breastfeeding  No    BMI 31.58 kg/m   OBGyn Exam   Female Chaperone present during breast and/or pelvic exam.  Assessment: 31 y.o. [id number] presenting for 6 week postpartum visit  Plan: Problem List Items Addressed This Visit   None Visit Diagnoses     Postpartum care following vaginal delivery    -  Primary        1) Contraception Education given regarding options for contraception, including IUD placement, oral contraceptives, and she shares a hx of difficulty tolerating OCPs. Hx of endometriosis- Today she opts not to start on any hormonal birth control.Marland Kitchen  2)  Pap - ASCCP guidelines and rational discussed.  Patient  opts for annual screening interval  3) Patient underwent screening for postpartum depression with no concerns noted.  4) Follow up 1 year for routine annual exam  Mirna Mires, CNM  01/28/2021 2:55 PM   01/28/2021 2:52 PM

## 2022-02-04 DIAGNOSIS — Z419 Encounter for procedure for purposes other than remedying health state, unspecified: Secondary | ICD-10-CM | POA: Diagnosis not present

## 2022-03-05 DIAGNOSIS — Z419 Encounter for procedure for purposes other than remedying health state, unspecified: Secondary | ICD-10-CM | POA: Diagnosis not present

## 2022-04-01 DIAGNOSIS — Z3483 Encounter for supervision of other normal pregnancy, third trimester: Secondary | ICD-10-CM | POA: Diagnosis not present

## 2022-04-05 DIAGNOSIS — Z419 Encounter for procedure for purposes other than remedying health state, unspecified: Secondary | ICD-10-CM | POA: Diagnosis not present

## 2022-04-08 DIAGNOSIS — O212 Late vomiting of pregnancy: Secondary | ICD-10-CM | POA: Diagnosis not present

## 2022-04-08 DIAGNOSIS — O99891 Other specified diseases and conditions complicating pregnancy: Secondary | ICD-10-CM | POA: Diagnosis not present

## 2022-04-08 DIAGNOSIS — R197 Diarrhea, unspecified: Secondary | ICD-10-CM | POA: Diagnosis not present

## 2022-04-08 DIAGNOSIS — Z3A38 38 weeks gestation of pregnancy: Secondary | ICD-10-CM | POA: Diagnosis not present

## 2022-04-08 DIAGNOSIS — O471 False labor at or after 37 completed weeks of gestation: Secondary | ICD-10-CM | POA: Diagnosis not present

## 2022-04-13 DIAGNOSIS — Z3A38 38 weeks gestation of pregnancy: Secondary | ICD-10-CM | POA: Diagnosis not present

## 2022-04-13 DIAGNOSIS — O26893 Other specified pregnancy related conditions, third trimester: Secondary | ICD-10-CM | POA: Diagnosis not present

## 2022-04-13 DIAGNOSIS — O163 Unspecified maternal hypertension, third trimester: Secondary | ICD-10-CM | POA: Diagnosis not present

## 2022-04-13 DIAGNOSIS — O368330 Maternal care for abnormalities of the fetal heart rate or rhythm, third trimester, not applicable or unspecified: Secondary | ICD-10-CM | POA: Diagnosis not present

## 2022-04-13 DIAGNOSIS — R030 Elevated blood-pressure reading, without diagnosis of hypertension: Secondary | ICD-10-CM | POA: Diagnosis not present

## 2022-04-15 DIAGNOSIS — D509 Iron deficiency anemia, unspecified: Secondary | ICD-10-CM | POA: Diagnosis not present

## 2022-04-15 DIAGNOSIS — O99892 Other specified diseases and conditions complicating childbirth: Secondary | ICD-10-CM | POA: Diagnosis not present

## 2022-04-15 DIAGNOSIS — Z3A39 39 weeks gestation of pregnancy: Secondary | ICD-10-CM | POA: Diagnosis not present

## 2022-04-15 DIAGNOSIS — O9902 Anemia complicating childbirth: Secondary | ICD-10-CM | POA: Diagnosis not present

## 2022-04-15 DIAGNOSIS — O1414 Severe pre-eclampsia complicating childbirth: Secondary | ICD-10-CM | POA: Diagnosis not present

## 2022-04-15 DIAGNOSIS — O99891 Other specified diseases and conditions complicating pregnancy: Secondary | ICD-10-CM | POA: Diagnosis not present

## 2022-04-15 DIAGNOSIS — Z3483 Encounter for supervision of other normal pregnancy, third trimester: Secondary | ICD-10-CM | POA: Diagnosis not present

## 2022-04-15 DIAGNOSIS — R7401 Elevation of levels of liver transaminase levels: Secondary | ICD-10-CM | POA: Diagnosis not present

## 2022-04-15 DIAGNOSIS — F418 Other specified anxiety disorders: Secondary | ICD-10-CM | POA: Diagnosis not present

## 2022-04-15 DIAGNOSIS — Z3493 Encounter for supervision of normal pregnancy, unspecified, third trimester: Secondary | ICD-10-CM | POA: Diagnosis not present

## 2022-04-15 DIAGNOSIS — R030 Elevated blood-pressure reading, without diagnosis of hypertension: Secondary | ICD-10-CM | POA: Diagnosis not present

## 2022-04-15 DIAGNOSIS — Z302 Encounter for sterilization: Secondary | ICD-10-CM | POA: Diagnosis not present

## 2022-04-15 DIAGNOSIS — O99344 Other mental disorders complicating childbirth: Secondary | ICD-10-CM | POA: Diagnosis not present

## 2022-04-16 DIAGNOSIS — Z3A39 39 weeks gestation of pregnancy: Secondary | ICD-10-CM | POA: Diagnosis not present

## 2022-04-16 DIAGNOSIS — O1414 Severe pre-eclampsia complicating childbirth: Secondary | ICD-10-CM | POA: Diagnosis not present

## 2022-04-17 DIAGNOSIS — R162 Hepatomegaly with splenomegaly, not elsewhere classified: Secondary | ICD-10-CM | POA: Diagnosis not present

## 2022-04-18 DIAGNOSIS — N838 Other noninflammatory disorders of ovary, fallopian tube and broad ligament: Secondary | ICD-10-CM | POA: Diagnosis not present

## 2022-04-18 DIAGNOSIS — Z302 Encounter for sterilization: Secondary | ICD-10-CM | POA: Diagnosis not present

## 2022-05-03 IMAGING — US US OB COMP +14 WK
1 series · 15 of 28 positions shown · non-contrast
Comparison: none

CLINICAL DATA: Second trimester pregnancy for fetal anatomy survey.

EXAM:
OBSTETRICAL ULTRASOUND >14 WKS

[Series 1: us ob comp + 14 wk · 15 of 77 slices shown]
[im 1/77]
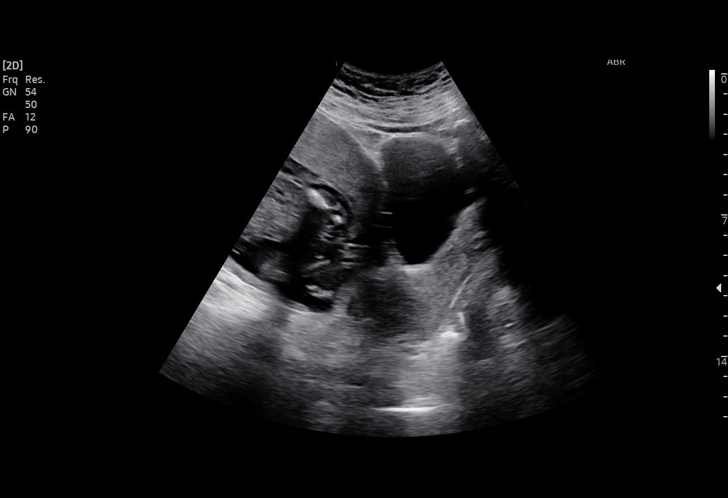
[im 6/77]
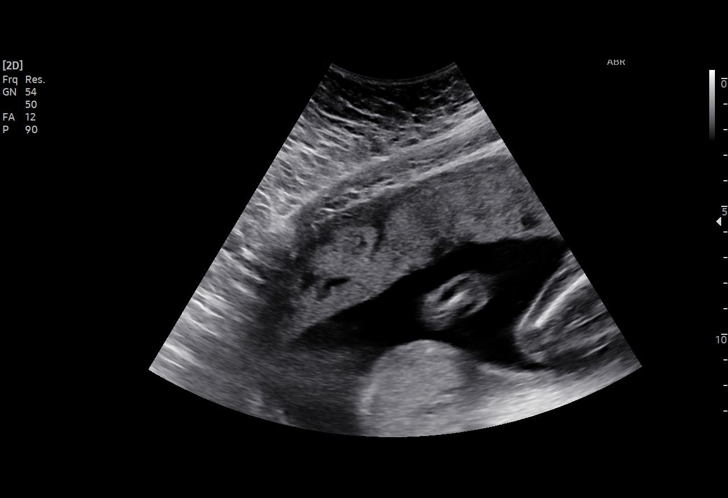
[im 12/77]
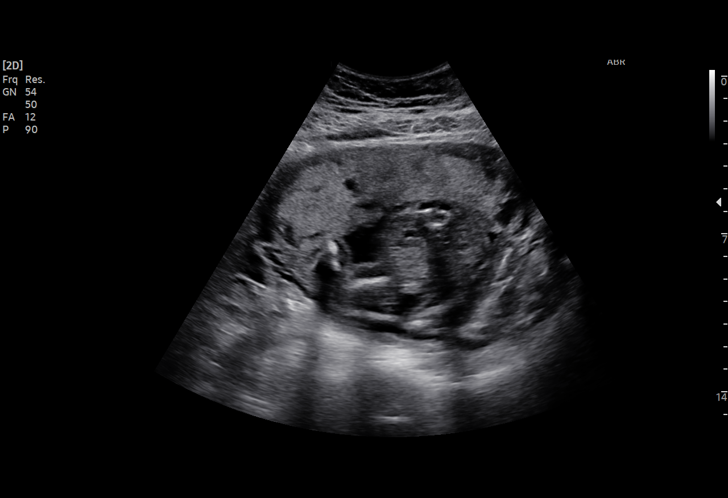
[im 17/77]
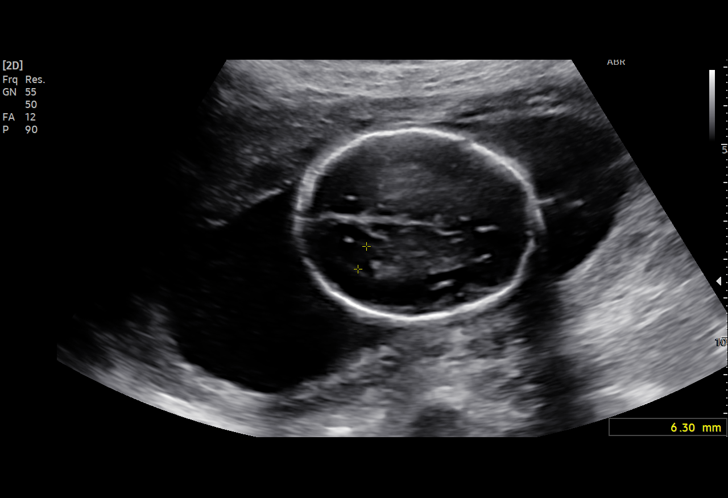
[im 23/77]
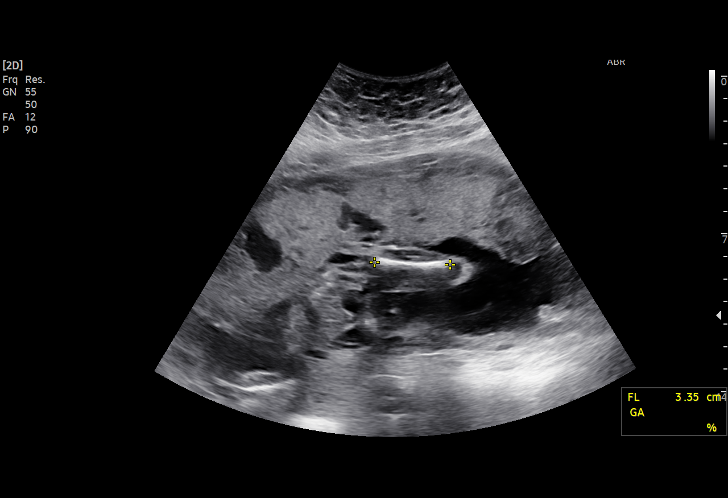
[im 29/77]
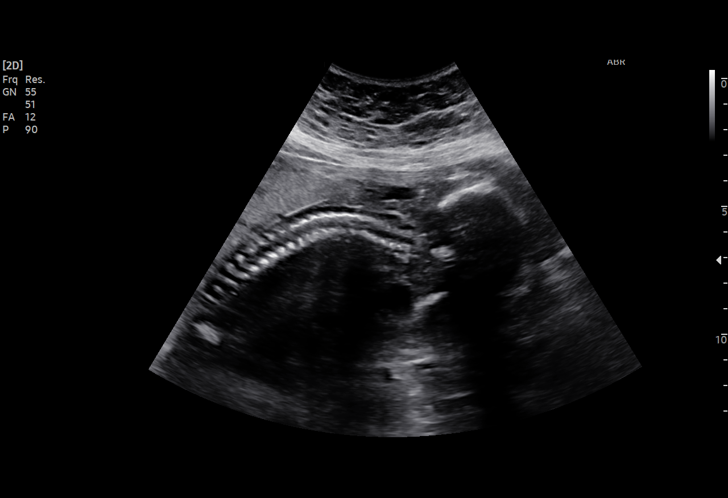
[im 34/77]
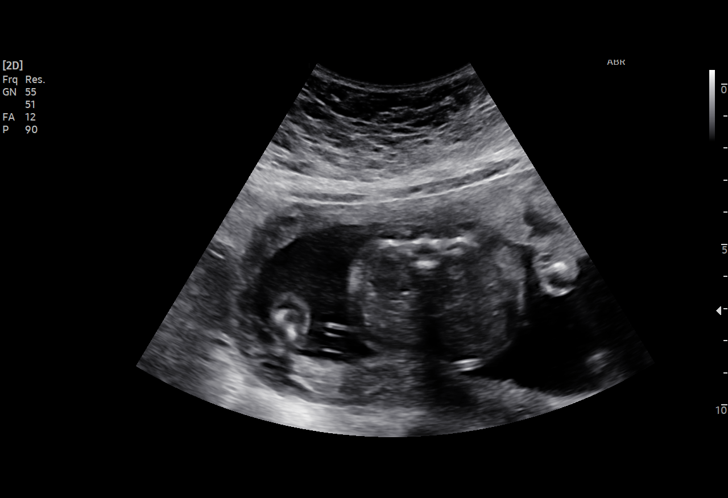
[im 40/77]
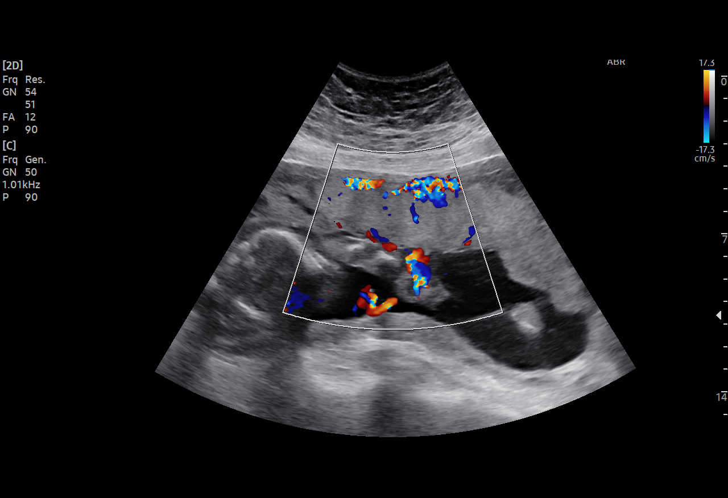
[im 43/77]
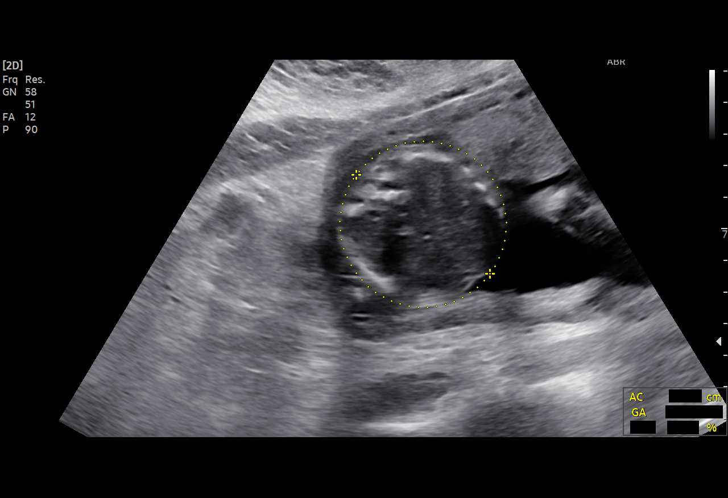
[im 48/77]
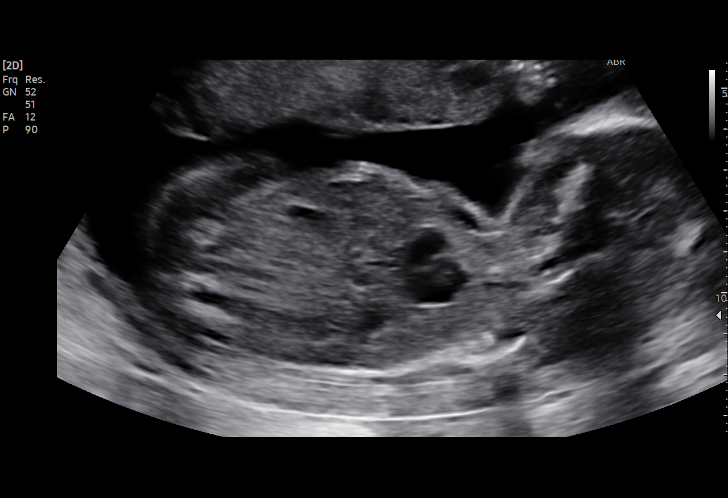
[im 54/77]
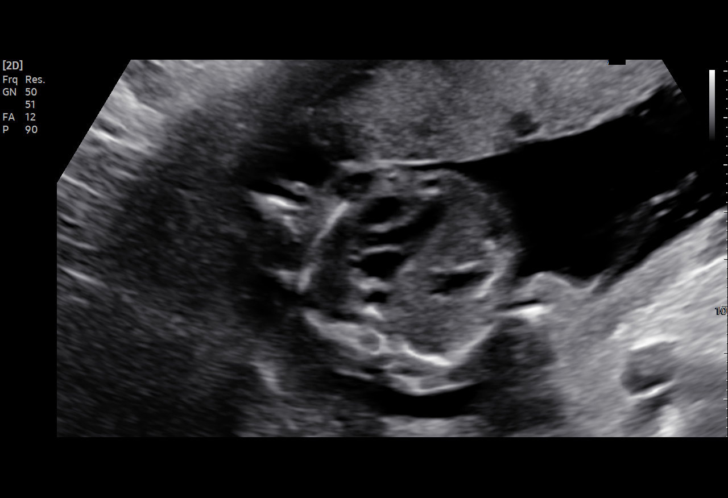
[im 60/77]
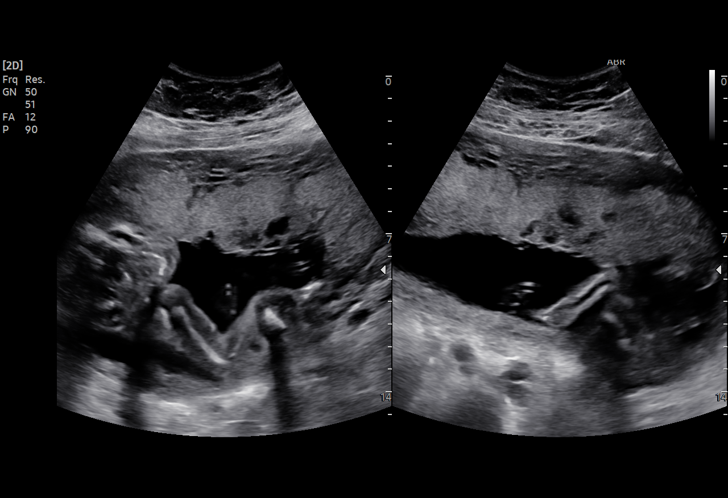
[im 65/77]
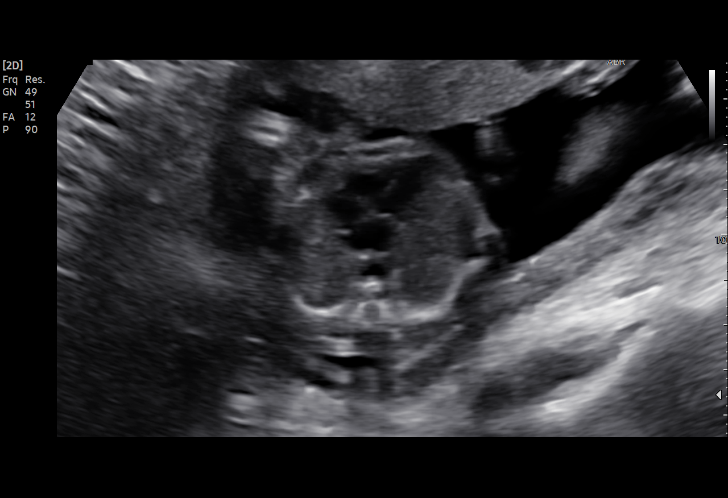
[im 71/77]
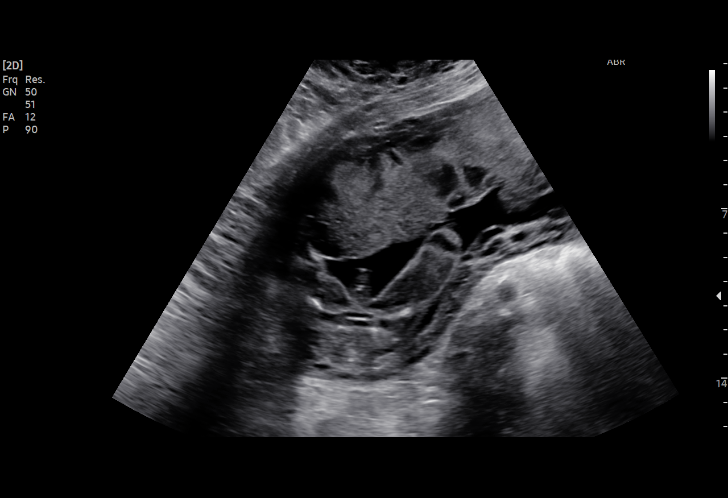
[im 77/77]
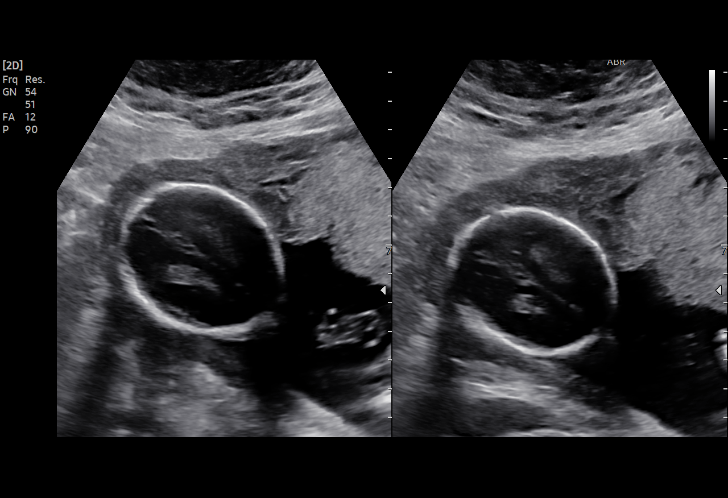

[15 of 28 positions shown; findings below may reference images not displayed]

FINDINGS: Number of Fetuses: 1

Heart Rate:  145 bpm

Movement: Yes

Presentation: Variable

Previa: No

Placental Location: Anterior

Amniotic Fluid (Subjective): Within normal limits

Amniotic Fluid (Objective):

Vertical pocket = 3.5cm

FETAL BIOMETRY

BPD: 5.0cm 21w 0d

HC:   18.4cm 20w 6d

AC:   16.9cm 21w 6d

FL:   3.3cm 20w 2d

Current Mean GA: 20w 5d US EDC: 12/20/2020

FETAL ANATOMY

Lateral Ventricles: Appears normal

Thalami/CSP: Appears normal

Posterior Fossa:  Appears normal

Nuchal Region: Appears normal   NFT= N/A > 20 WKS

Upper Lip: Appears normal

Spine: Appears normal

4 Chamber Heart on Left: Appears normal

LVOT: Appears normal

RVOT: Not visualized

Stomach on Left: Appears normal

3 Vessel Cord: Appears normal

Cord Insertion site: Appears normal

Kidneys: Appears normal

Bladder: Appears normal

Extremities: Appears normal

Sex: Male

Technically difficult due to: Fetal position

Maternal Findings:

Cervix:  3.8 cm TA
IMPRESSION: Single living IUP with estimated gestational age of 20 weeks 5 days,
and US EDC of 12/20/2020.

No fetal anomalies identified, although RVOT could not be
visualized. Followup ultrasound could be obtained in 3-4 weeks if
desired.

## 2022-05-05 DIAGNOSIS — Z419 Encounter for procedure for purposes other than remedying health state, unspecified: Secondary | ICD-10-CM | POA: Diagnosis not present

## 2022-05-24 DIAGNOSIS — R748 Abnormal levels of other serum enzymes: Secondary | ICD-10-CM | POA: Diagnosis not present

## 2022-05-24 DIAGNOSIS — Z3009 Encounter for other general counseling and advice on contraception: Secondary | ICD-10-CM | POA: Diagnosis not present

## 2022-05-24 DIAGNOSIS — O1415 Severe pre-eclampsia, complicating the puerperium: Secondary | ICD-10-CM | POA: Diagnosis not present

## 2022-06-01 IMAGING — US US OB FOLLOW-UP
1 series · 15 of 28 positions shown · non-contrast
Comparison: none

CLINICAL DATA: 29-year-old pregnant female presents for completion
of fetal anatomic survey.

EXAM:
OBSTETRIC 14+ WK ULTRASOUND FOLLOW-UP

[Series 1: us ob follow up · 15 of 48 slices shown]
[im 1/48]
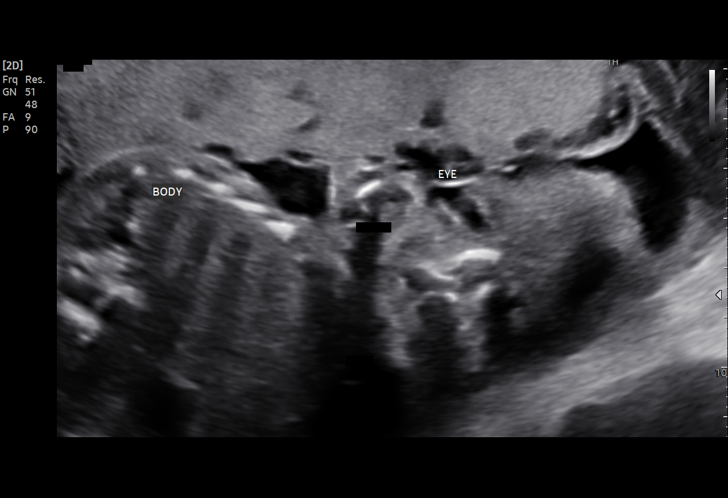
[im 4/48]
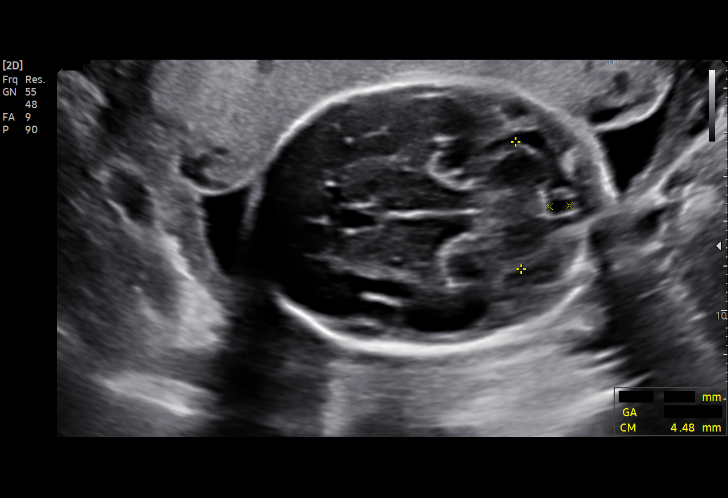
[im 7/48]
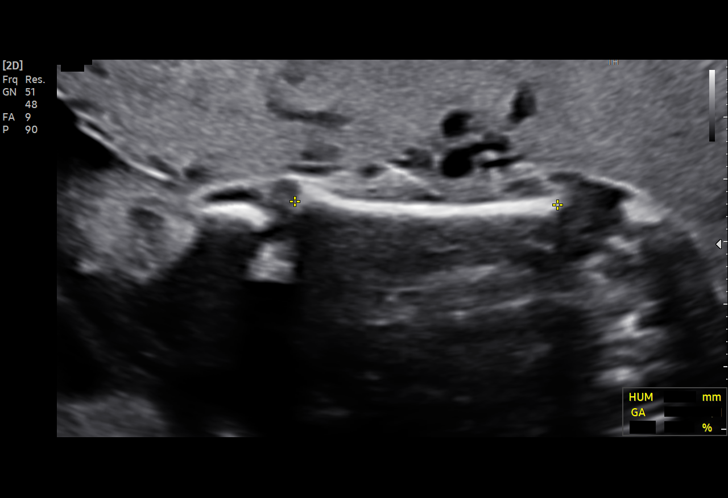
[im 11/48]
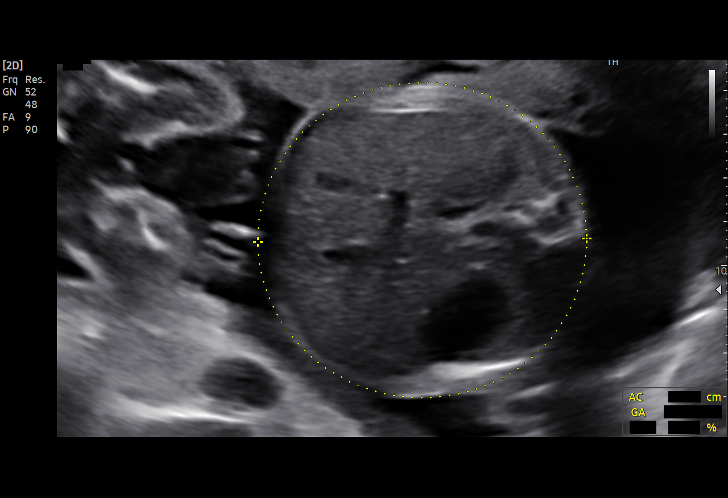
[im 14/48]
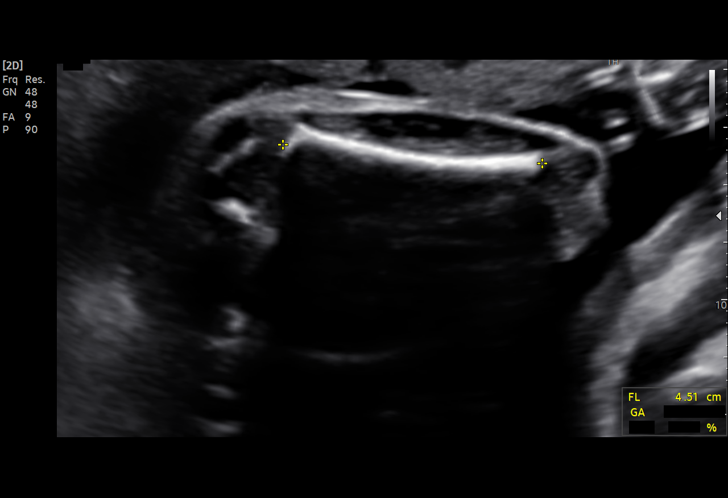
[im 18/48]
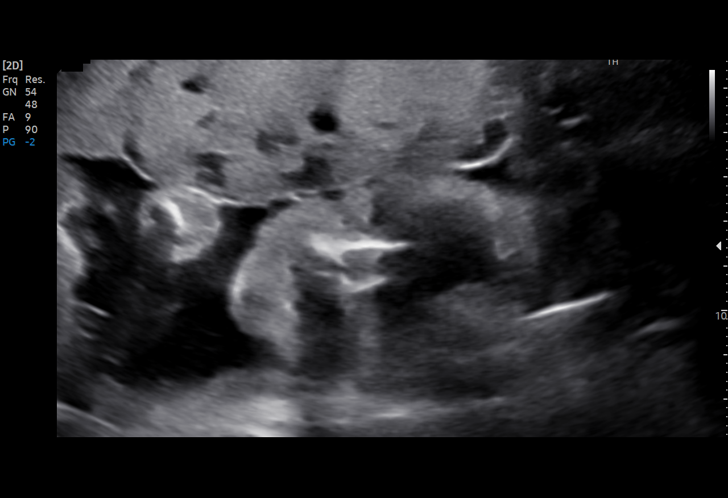
[im 21/48]
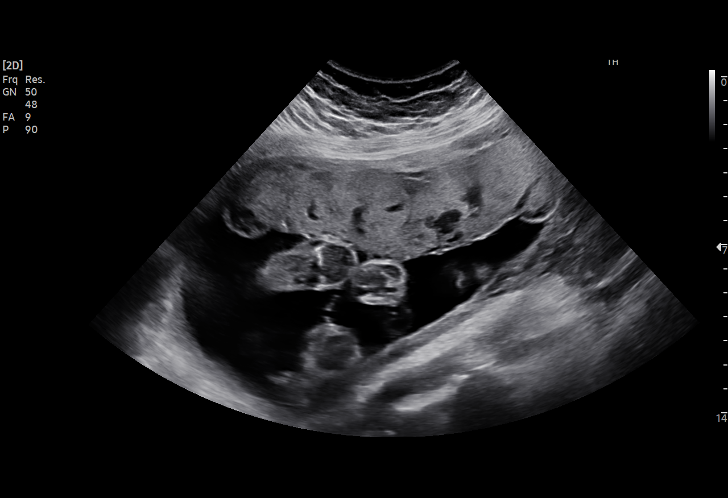
[im 25/48]
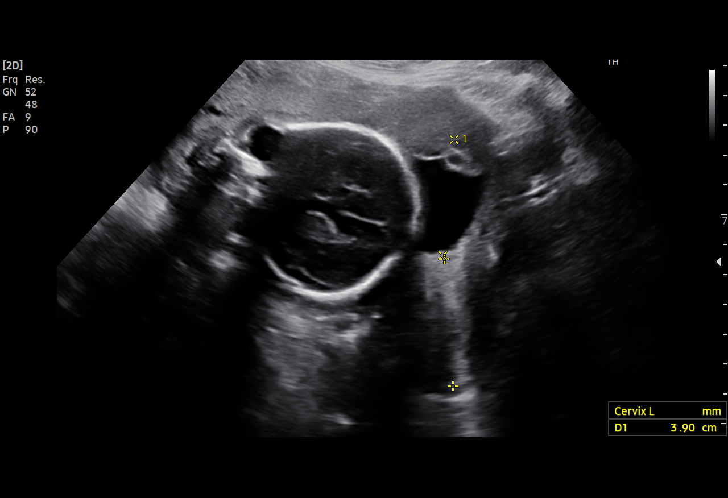
[im 27/48]
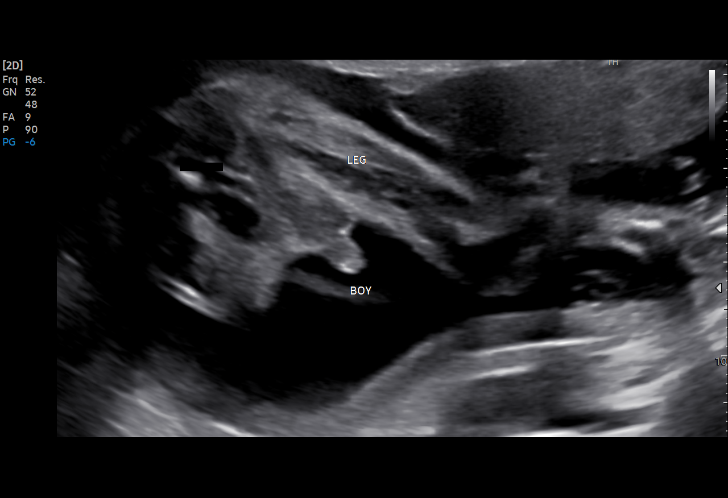
[im 30/48]
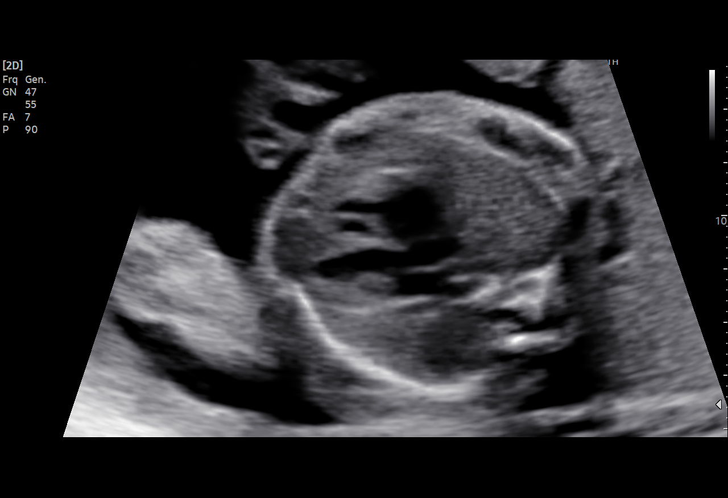
[im 34/48]
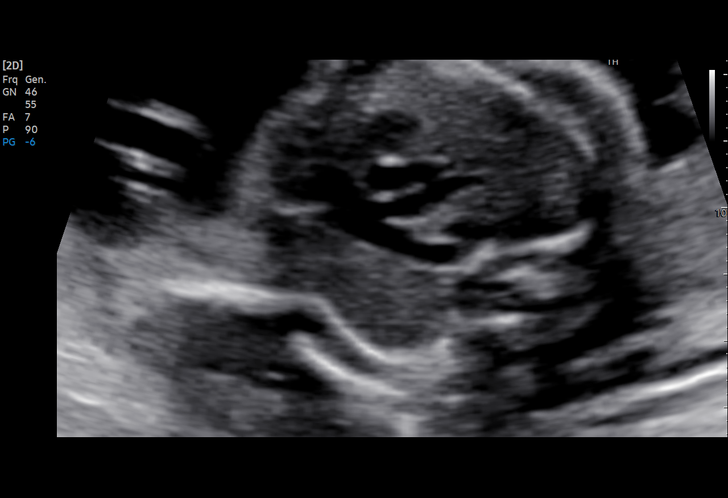
[im 37/48]
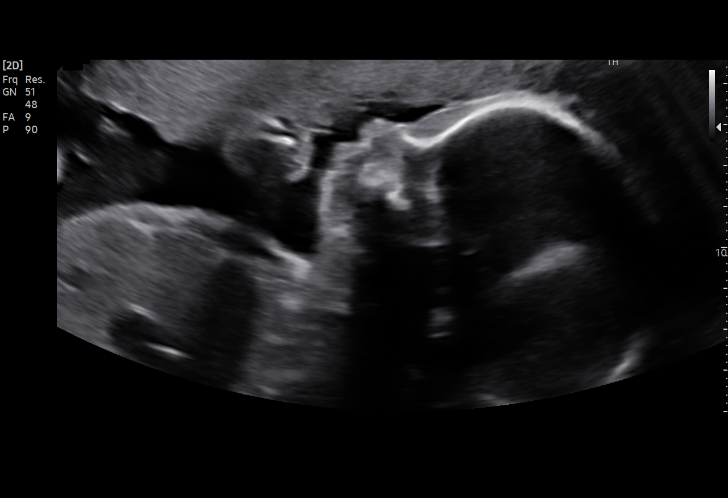
[im 41/48]
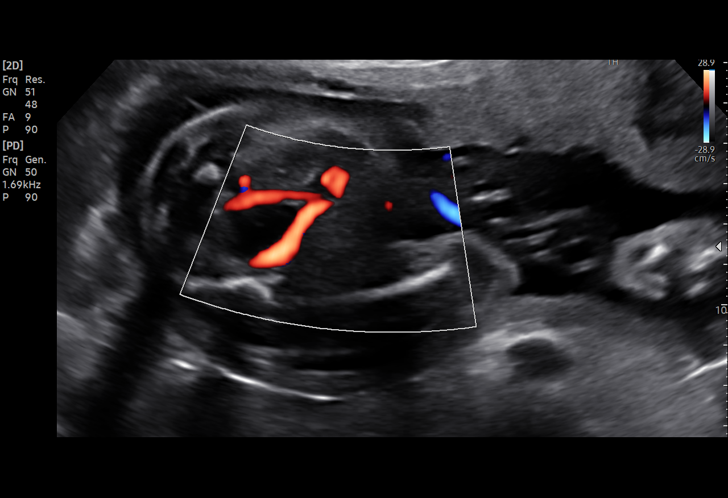
[im 44/48]
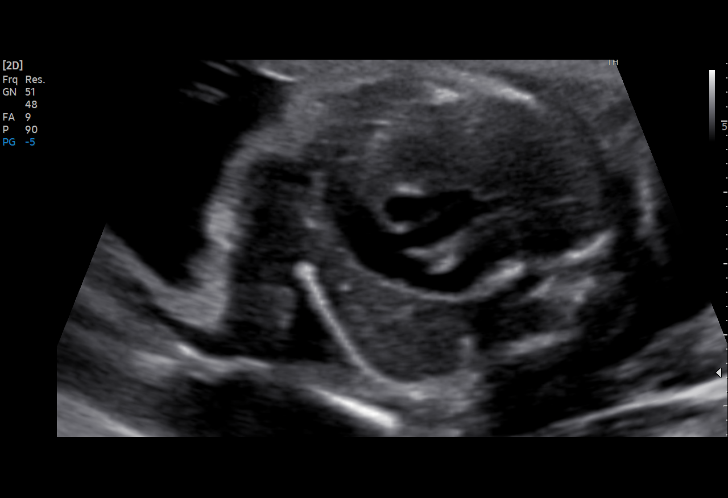
[im 48/48]
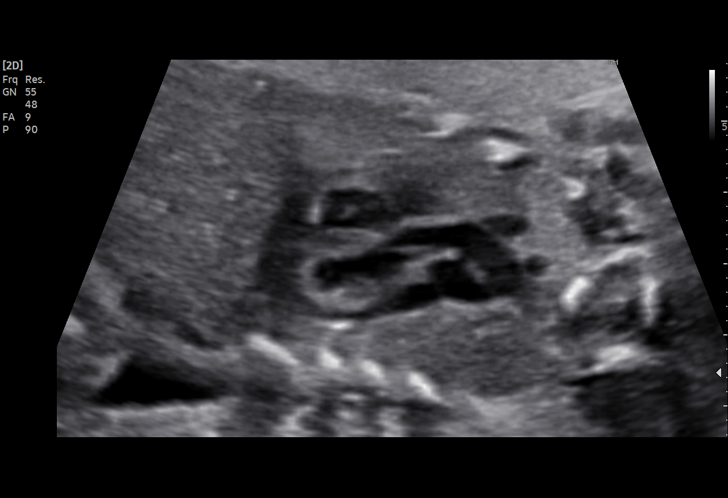

[15 of 28 positions shown; findings below may reference images not displayed]

FINDINGS: Number of Fetuses: 1

Heart Rate:  136 bpm

Movement: Yes

Presentation: Cephalic

Previa: No

Placental Location: Anterior

Amniotic Fluid (Subjective): Normal

Amniotic Fluid (Objective):

Vertical pocket 6.0cm

FETAL BIOMETRY

BPD:  6.2cm 25w 0d

HC:    23.2cm 25w 2d

AC:    22.9cm 27w 2d

FL:    4.5cm 25w 0d

Current Mean GA: 25w 4d US EDC: 12/15/2020

Assigned GA: 25w 0d Assigned EDC: 12/19/2020

Estimated Fetal Weight:  898g 68%ile

FETAL ANATOMY

Lateral Ventricles: Appears normal

Thalami/CSP: Appears normal

Posterior Fossa: Appears normal

Nuchal Region: Appears normal

Upper Lip: Appears normal

Spine: Previously seen

4 Chamber Heart on Left: Appears normal

LVOT: Appears normal

RVOT: Appears normal

Stomach on Left: Appears normal

3 Vessel Cord: Appears normal

Cord Insertion site: Previously seen

Kidneys: Appears normal

Bladder: Appears normal

Extremities: Previously seen

Sex: Male external genitalia

Maternal Findings:

Cervix: Cervix length approximately 4.3 cm on limited transabdominal
views, with no evidence of internal cervical funneling.
IMPRESSION: 1. Single living intrauterine gestation in cephalic lie at 25 weeks
4 days by average ultrasound age, with appropriate interval fetal
growth.
2. No fetal or maternal abnormalities detected.

## 2022-06-05 DIAGNOSIS — Z419 Encounter for procedure for purposes other than remedying health state, unspecified: Secondary | ICD-10-CM | POA: Diagnosis not present

## 2022-07-05 DIAGNOSIS — Z419 Encounter for procedure for purposes other than remedying health state, unspecified: Secondary | ICD-10-CM | POA: Diagnosis not present

## 2022-08-05 DIAGNOSIS — Z419 Encounter for procedure for purposes other than remedying health state, unspecified: Secondary | ICD-10-CM | POA: Diagnosis not present

## 2022-09-05 DIAGNOSIS — Z419 Encounter for procedure for purposes other than remedying health state, unspecified: Secondary | ICD-10-CM | POA: Diagnosis not present

## 2022-10-05 DIAGNOSIS — Z419 Encounter for procedure for purposes other than remedying health state, unspecified: Secondary | ICD-10-CM | POA: Diagnosis not present

## 2022-11-05 DIAGNOSIS — Z419 Encounter for procedure for purposes other than remedying health state, unspecified: Secondary | ICD-10-CM | POA: Diagnosis not present

## 2022-12-05 DIAGNOSIS — Z419 Encounter for procedure for purposes other than remedying health state, unspecified: Secondary | ICD-10-CM | POA: Diagnosis not present

## 2023-01-05 DIAGNOSIS — Z419 Encounter for procedure for purposes other than remedying health state, unspecified: Secondary | ICD-10-CM | POA: Diagnosis not present

## 2023-02-05 DIAGNOSIS — Z419 Encounter for procedure for purposes other than remedying health state, unspecified: Secondary | ICD-10-CM | POA: Diagnosis not present

## 2023-03-05 DIAGNOSIS — Z419 Encounter for procedure for purposes other than remedying health state, unspecified: Secondary | ICD-10-CM | POA: Diagnosis not present

## 2023-04-16 DIAGNOSIS — Z419 Encounter for procedure for purposes other than remedying health state, unspecified: Secondary | ICD-10-CM | POA: Diagnosis not present

## 2023-05-16 DIAGNOSIS — Z419 Encounter for procedure for purposes other than remedying health state, unspecified: Secondary | ICD-10-CM | POA: Diagnosis not present

## 2023-06-16 DIAGNOSIS — Z419 Encounter for procedure for purposes other than remedying health state, unspecified: Secondary | ICD-10-CM | POA: Diagnosis not present

## 2023-07-16 DIAGNOSIS — Z419 Encounter for procedure for purposes other than remedying health state, unspecified: Secondary | ICD-10-CM | POA: Diagnosis not present

## 2023-08-16 DIAGNOSIS — Z419 Encounter for procedure for purposes other than remedying health state, unspecified: Secondary | ICD-10-CM | POA: Diagnosis not present

## 2023-09-16 DIAGNOSIS — Z419 Encounter for procedure for purposes other than remedying health state, unspecified: Secondary | ICD-10-CM | POA: Diagnosis not present

## 2023-11-16 DIAGNOSIS — Z419 Encounter for procedure for purposes other than remedying health state, unspecified: Secondary | ICD-10-CM | POA: Diagnosis not present
